# Patient Record
Sex: Male | Born: 1955
Health system: Southern US, Community
[De-identification: ages and names within clinical notes are randomized; demographics above are authoritative.]

## PROBLEM LIST (undated history)

## (undated) DIAGNOSIS — G20 Parkinson's disease: Secondary | ICD-10-CM

## (undated) DIAGNOSIS — R079 Chest pain, unspecified: Secondary | ICD-10-CM

## (undated) DIAGNOSIS — I4892 Unspecified atrial flutter: Secondary | ICD-10-CM

---

## 2017-01-10 DIAGNOSIS — E860 Dehydration: Secondary | ICD-10-CM | POA: Diagnosis not present

## 2017-01-10 DIAGNOSIS — R112 Nausea with vomiting, unspecified: Secondary | ICD-10-CM | POA: Diagnosis not present

## 2017-01-10 DIAGNOSIS — Z720 Tobacco use: Secondary | ICD-10-CM | POA: Diagnosis not present

## 2017-01-10 DIAGNOSIS — R34 Anuria and oliguria: Secondary | ICD-10-CM | POA: Diagnosis not present

## 2017-01-10 DIAGNOSIS — N179 Acute kidney failure, unspecified: Secondary | ICD-10-CM | POA: Diagnosis not present

## 2017-01-10 DIAGNOSIS — R7989 Other specified abnormal findings of blood chemistry: Secondary | ICD-10-CM | POA: Diagnosis not present

## 2017-01-10 DIAGNOSIS — R1110 Vomiting, unspecified: Secondary | ICD-10-CM | POA: Diagnosis not present

## 2017-08-03 ENCOUNTER — Other Ambulatory Visit: Payer: Self-pay

## 2017-08-03 ENCOUNTER — Emergency Department (HOSPITAL_COMMUNITY): Payer: Medicare Other

## 2017-08-03 ENCOUNTER — Encounter (HOSPITAL_COMMUNITY): Payer: Self-pay | Admitting: Emergency Medicine

## 2017-08-03 ENCOUNTER — Inpatient Hospital Stay (HOSPITAL_COMMUNITY)
Admission: EM | Admit: 2017-08-03 | Discharge: 2017-08-06 | DRG: 287 | Disposition: A | Discharge status: Home / Self Care | Payer: Medicare Other | Attending: Internal Medicine | Admitting: Internal Medicine

## 2017-08-03 DIAGNOSIS — G20 Parkinson's disease: Secondary | ICD-10-CM | POA: Diagnosis present

## 2017-08-03 DIAGNOSIS — I4892 Unspecified atrial flutter: Secondary | ICD-10-CM | POA: Diagnosis not present

## 2017-08-03 DIAGNOSIS — I10 Essential (primary) hypertension: Secondary | ICD-10-CM | POA: Diagnosis not present

## 2017-08-03 DIAGNOSIS — R778 Other specified abnormalities of plasma proteins: Secondary | ICD-10-CM

## 2017-08-03 DIAGNOSIS — I248 Other forms of acute ischemic heart disease: Secondary | ICD-10-CM | POA: Diagnosis not present

## 2017-08-03 DIAGNOSIS — R748 Abnormal levels of other serum enzymes: Secondary | ICD-10-CM | POA: Diagnosis not present

## 2017-08-03 DIAGNOSIS — K625 Hemorrhage of anus and rectum: Secondary | ICD-10-CM | POA: Diagnosis not present

## 2017-08-03 DIAGNOSIS — F17210 Nicotine dependence, cigarettes, uncomplicated: Secondary | ICD-10-CM | POA: Diagnosis present

## 2017-08-03 DIAGNOSIS — I420 Dilated cardiomyopathy: Secondary | ICD-10-CM

## 2017-08-03 DIAGNOSIS — I2510 Atherosclerotic heart disease of native coronary artery without angina pectoris: Secondary | ICD-10-CM | POA: Diagnosis present

## 2017-08-03 DIAGNOSIS — F1010 Alcohol abuse, uncomplicated: Secondary | ICD-10-CM | POA: Diagnosis not present

## 2017-08-03 DIAGNOSIS — K7689 Other specified diseases of liver: Secondary | ICD-10-CM | POA: Diagnosis not present

## 2017-08-03 DIAGNOSIS — R002 Palpitations: Secondary | ICD-10-CM | POA: Diagnosis not present

## 2017-08-03 DIAGNOSIS — R945 Abnormal results of liver function studies: Secondary | ICD-10-CM

## 2017-08-03 DIAGNOSIS — R079 Chest pain, unspecified: Secondary | ICD-10-CM | POA: Diagnosis not present

## 2017-08-03 DIAGNOSIS — R112 Nausea with vomiting, unspecified: Secondary | ICD-10-CM | POA: Diagnosis present

## 2017-08-03 DIAGNOSIS — R197 Diarrhea, unspecified: Secondary | ICD-10-CM | POA: Diagnosis present

## 2017-08-03 DIAGNOSIS — Z791 Long term (current) use of non-steroidal anti-inflammatories (NSAID): Secondary | ICD-10-CM | POA: Diagnosis not present

## 2017-08-03 DIAGNOSIS — I471 Supraventricular tachycardia, unspecified: Secondary | ICD-10-CM

## 2017-08-03 DIAGNOSIS — R7989 Other specified abnormal findings of blood chemistry: Secondary | ICD-10-CM

## 2017-08-03 DIAGNOSIS — R740 Nonspecific elevation of levels of transaminase and lactic acid dehydrogenase [LDH]: Secondary | ICD-10-CM | POA: Diagnosis present

## 2017-08-03 DIAGNOSIS — Z720 Tobacco use: Secondary | ICD-10-CM | POA: Diagnosis not present

## 2017-08-03 HISTORY — DX: Chest pain, unspecified: R07.9

## 2017-08-03 HISTORY — DX: Parkinson's disease: G20

## 2017-08-03 HISTORY — DX: Unspecified atrial flutter: I48.92

## 2017-08-03 LAB — COMPREHENSIVE METABOLIC PANEL
ALBUMIN: 3.8 g/dL (ref 3.5–5.0)
ALT: 93 U/L — ABNORMAL HIGH (ref 17–63)
AST: 79 U/L — AB (ref 15–41)
Alkaline Phosphatase: 51 U/L (ref 38–126)
Anion gap: 15 (ref 5–15)
BUN: 13 mg/dL (ref 6–20)
CHLORIDE: 109 mmol/L (ref 101–111)
CO2: 15 mmol/L — AB (ref 22–32)
Calcium: 8.7 mg/dL — ABNORMAL LOW (ref 8.9–10.3)
Creatinine, Ser: 0.95 mg/dL (ref 0.61–1.24)
GFR calc Af Amer: >60 mL/min (ref >60)
GFR calc non Af Amer: >60 mL/min (ref >60)
Glucose, Bld: 75 mg/dL (ref 65–99)
POTASSIUM: 3.6 mmol/L (ref 3.5–5.1)
SODIUM: 139 mmol/L (ref 135–145)
Total Bilirubin: 1 mg/dL (ref 0.3–1.2)
Total Protein: 7.1 g/dL (ref 6.5–8.1)

## 2017-08-03 LAB — I-STAT CHEM 8, ED
BUN: 17 mg/dL (ref 6–20)
CALCIUM ION: 0.99 mmol/L — AB (ref 1.15–1.40)
CREATININE: 1.1 mg/dL (ref 0.61–1.24)
Chloride: 108 mmol/L (ref 101–111)
GLUCOSE: 98 mg/dL (ref 65–99)
HCT: 49 % (ref 39.0–52.0)
Hemoglobin: 16.7 g/dL (ref 13.0–17.0)
Potassium: 4 mmol/L (ref 3.5–5.1)
Sodium: 138 mmol/L (ref 135–145)
TCO2: 17 mmol/L — AB (ref 22–32)

## 2017-08-03 LAB — CBC WITH DIFFERENTIAL/PLATELET
BASOS ABS: 0 10*3/uL (ref 0.0–0.1)
BASOS PCT: 0 %
EOS ABS: 0 10*3/uL (ref 0.0–0.7)
EOS PCT: 0 %
HCT: 47.5 % (ref 39.0–52.0)
Hemoglobin: 15.7 g/dL (ref 13.0–17.0)
Lymphocytes Relative: 19 %
Lymphs Abs: 0.9 10*3/uL (ref 0.7–4.0)
MCH: 31.9 pg (ref 26.0–34.0)
MCHC: 33.1 g/dL (ref 30.0–36.0)
MCV: 96.5 fL (ref 78.0–100.0)
MONO ABS: 0.5 10*3/uL (ref 0.1–1.0)
Monocytes Relative: 12 %
Neutro Abs: 3.2 10*3/uL (ref 1.7–7.7)
Neutrophils Relative %: 69 %
PLATELETS: 145 10*3/uL — AB (ref 150–400)
RBC: 4.92 MIL/uL (ref 4.22–5.81)
RDW: 13.5 % (ref 11.5–15.5)
WBC: 4.7 10*3/uL (ref 4.0–10.5)

## 2017-08-03 LAB — I-STAT TROPONIN, ED: TROPONIN I, POC: 0.05 ng/mL (ref 0.00–0.08)

## 2017-08-03 LAB — TSH: TSH: 0.406 u[IU]/mL (ref 0.350–4.500)

## 2017-08-03 LAB — TROPONIN I: Troponin I: 2.08 ng/mL (ref <0.03)

## 2017-08-03 LAB — T4, FREE: Free T4: 0.87 ng/dL (ref 0.61–1.12)

## 2017-08-03 MED ORDER — METOPROLOL TARTRATE 25 MG PO TABS
25.0000 mg | ORAL_TABLET | Freq: Two times a day (BID) | ORAL | Status: DC
  Administered 2017-08-03 – 2017-08-06 (×6): 25 mg via ORAL
  Filled 2017-08-03 (×6): qty 1

## 2017-08-03 MED ORDER — HEPARIN (PORCINE) IN NACL 100-0.45 UNIT/ML-% IJ SOLN
1250.0000 [IU]/h | INTRAMUSCULAR | Status: DC
  Administered 2017-08-03: 950 [IU]/h via INTRAVENOUS
  Administered 2017-08-04: 1200 [IU]/h via INTRAVENOUS
  Administered 2017-08-05: 1350 [IU]/h via INTRAVENOUS
  Administered 2017-08-06: 1250 [IU]/h via INTRAVENOUS
  Filled 2017-08-03 (×4): qty 250

## 2017-08-03 MED ORDER — NITROGLYCERIN 2 % TD OINT
0.5000 [in_us] | TOPICAL_OINTMENT | Freq: Three times a day (TID) | TRANSDERMAL | Status: DC
Start: 2017-08-03 — End: 2017-08-06

## 2017-08-03 MED ORDER — HEPARIN BOLUS VIA INFUSION
3500.0000 [IU] | Freq: Once | INTRAVENOUS | Status: AC
  Administered 2017-08-03: 3500 [IU] via INTRAVENOUS
  Filled 2017-08-03: qty 3500

## 2017-08-03 MED ORDER — ASPIRIN EC 325 MG PO TBEC
325.0000 mg | DELAYED_RELEASE_TABLET | Freq: Every day | ORAL | Status: AC
  Administered 2017-08-03: 325 mg via ORAL
  Filled 2017-08-03: qty 1

## 2017-08-03 NOTE — H&P (Addendum)
TRH H&P   Patient Demographics:    Brian George, is a 62 y.o. male  MRN: [id number]   DOB - Aug 31, 1955  Admit Date - 08/03/2017  Outpatient Primary MD for the patient is Patient, No Pcp Per None  Referring MD/NP/PA: Virgel Manifold  Outpatient Specialists:   No cardiology  Patient coming from: home  Chief Complaint  Patient presents with  . Chest Pain      HPI:    Brian George  is a 62 y.o. male,  w c/o dull throbbing in the chest (mid) started about 11am. After finishing drinking a beer.   Pt denies fever, chills, cough, sob, heartburn, abd pain.  While he was in the EMS truck , given a couple of injections, and during the ride had slight n/v, (no blood),  diarrhea (loose stool) with some blood in there.   In ED,   CXR IMPRESSION: 1. 1 cm nodular opacity overlying the mid left lung which may represent nipple. Recommend repeat chest radiograph with nipple markers when able. 2. Cardiomegaly without evidence of acute cardiopulmonary disease.   Wbc 4.7, Hgb 15.7, Plt 145 Na 139, K 3.6, Bun 13, Creatinine 0.95 Ast 79, Alt 93, Alk phos 51, T. Bili 1.0  Hco3 15  EKG atrial flutter 2:1  Pt will be admitted for Atrial flutter with RVR,n/v, diarrhea, 1cm lung nodule, abnormal liver function.      Review of systems:    In addition to the HPI above,  No Fever-chills, No Headache, No changes with Vision or hearing, No problems swallowing food or Liquids, No Cough or Shortness of Breath,  No Blood in  Urine, No dysuria, No new skin rashes or bruises, No new joints pains-aches,  No new weakness, tingling, numbness in any extremity, No recent weight gain or loss, No polyuria, polydypsia or polyphagia, No significant Mental Stressors.  A full 10 point Review of Systems was done, except as stated above, all other Review of Systems were negative.   With Past  History of the following :    Past Medical History:  Diagnosis Date  . Atrial flutter with rapid ventricular response ([place]) 08/03/2017  . Chest pain with moderate risk for cardiac etiology 08/03/2017  . Parkinson's disease ([place])       History reviewed. No pertinent surgical history.    Social History:     Social History   Tobacco Use  . Smoking status: Current Every Day Smoker    Packs/day: 0.50    Types: Cigarettes  . Smokeless tobacco: Never Used  Substance Use Topics  . Alcohol use: Yes    Alcohol/week: 25.2 oz    Types: 42 Cans of beer per week    Comment: six-pack per day     Lives - at home  Mobility - walks by self    Family History :     Family History  Problem Relation Age of Onset  . Heart defect Father 33       Congenital heart disease  . Heart defect Brother 38       Congenital heart disease     Home Medications:   Prior to Admission medications   Not on File     Allergies:    Not on File   Physical Exam:   Vitals  Blood pressure (!) 151/113, pulse 79, resp. rate (!) 22, height '5\' 7"'$  (1.702 m), weight 64 kg (141 lb), SpO2 100 %.   1. General lying in bed in NAD,   2. Normal affect and insight, Not Suicidal or Homicidal, Awake Alert, Oriented X 3.  3. No F.N deficits, ALL C.Nerves Intact, Strength 5/5 all 4 extremities, Sensation intact all 4 extremities, Plantars down going.  4. Ears and Eyes appear Normal, Conjunctivae clear, PERRLA. Moist Oral Mucosa.  5. Supple Neck, No JVD, No cervical lymphadenopathy appriciated, No Carotid Bruits.  6. Symmetrical Chest wall movement, Good air movement bilaterally, CTAB.  7. RRR, No Gallops, Rubs or Murmurs, No Parasternal Heave.  8. Positive Bowel Sounds, Abdomen Soft, No tenderness, No organomegaly appriciated,No rebound -guarding or rigidity.  9.  No Cyanosis, Normal Skin Turgor, No Skin Rash or Bruise.  10. Good muscle tone,  joints appear normal , no effusions, Normal ROM.  11. No  Palpable Lymph Nodes in Neck or Axillae     Data Review:    CBC Recent Labs  Lab 08/03/17 1404 08/03/17 1440  WBC  --  4.7  HGB 16.7 15.7  HCT 49.0 47.5  PLT  --  145*  MCV  --  96.5  MCH  --  31.9  MCHC  --  33.1  RDW  --  13.5  LYMPHSABS  --  0.9  MONOABS  --  0.5  EOSABS  --  0.0  BASOSABS  --  0.0   ------------------------------------------------------------------------------------------------------------------  Chemistries  Recent Labs  Lab 08/03/17 1404 08/03/17 1440  NA 138 139  K 4.0 3.6  CL 108 109  CO2  --  15*  GLUCOSE 98 75  BUN 17 13  CREATININE 1.10 0.95  CALCIUM  --  8.7*  AST  --  79*  ALT  --  93*  ALKPHOS  --  51  BILITOT  --  1.0   ------------------------------------------------------------------------------------------------------------------ estimated creatinine clearance is 75.8 mL/min (by C-G formula based on SCr of 0.95 mg/dL). ------------------------------------------------------------------------------------------------------------------ No results for input(s): TSH, T4TOTAL, T3FREE, THYROIDAB in the last 72 hours.  Invalid input(s): FREET3  Coagulation profile No results for input(s): INR, PROTIME in the last 168 hours. ------------------------------------------------------------------------------------------------------------------- No results for input(s): DDIMER in the last 72 hours. -------------------------------------------------------------------------------------------------------------------  Cardiac Enzymes No results for input(s): CKMB, TROPONINI, MYOGLOBIN in the last 168 hours.  Invalid input(s): CK ------------------------------------------------------------------------------------------------------------------ No results found for: BNP   ---------------------------------------------------------------------------------------------------------------  Urinalysis No results found for: COLORURINE,  APPEARANCEUR, LABSPEC, PHURINE, GLUCOSEU, HGBUR, BILIRUBINUR, KETONESUR, PROTEINUR, UROBILINOGEN, NITRITE, LEUKOCYTESUR  ----------------------------------------------------------------------------------------------------------------   Imaging Results:    Dg Chest Port 1 View  Result Date: 08/03/2017 CLINICAL DATA:  Weakness. EXAM: PORTABLE CHEST 1 VIEW COMPARISON:  None. FINDINGS: Cardiomegaly identified. A 1 cm nodular opacity overlying the mid left lung is noted. There is no evidence of focal airspace disease, pulmonary edema, pleural effusion, or pneumothorax. No acute bony abnormalities are identified. IMPRESSION: 1. 1 cm nodular opacity overlying the mid left lung which may represent nipple. Recommend repeat chest radiograph with nipple markers when able.  2. Cardiomegaly without evidence of acute cardiopulmonary disease. Electronically Signed   By: Margarette Canada M.D.   On: 08/03/2017 14:04       Assessment & Plan:    Principal Problem:   Atrial flutter with rapid ventricular response (HCC) Active Problems:   Chest pain with moderate risk for cardiac etiology   Atrial flutter (HCC)   Rectal bleeding    Aflutter with RVR Resolved w lopressor 55m iv x1 Check tsh Aspirin 3226mpo qday Start Metoprolol 2580mo bid Cardiology consult, input appreciated  Chest discomfort Tele Trop I q6h x3 Check Cardiac echo Start Aspirin Start metoprolol Start nitro paste 2% topically q8h Check Lipid No Statin due to abnormal liver function  Troponin elevation Cont cycle cardiac marker Heparin iv pharmacy to dose Cardiology notified of trop elevation I will repeat EKG   Abnormal lft Check acute hepatitis panel Check RUQ ultrasound  N/v zofran 4mg45m q6h prn  protonix 40mg66mbid  Diarrhea Check GI pathogen panel Check C. Diff    Rectal bleeding NPO  Hydrate with ns iv Please contact GI in am regarding need for screening colonoscopy   DVT Prophylaxis    SCDs  AM Labs  Ordered, also please review Full Orders  Family Communication: Admission, patients condition and plan of care including tests being ordered have been discussed with the patient  who indicate understanding and agree with the plan and Code Status.  Code Status FULL CODE  Likely DC to  home  Condition GUARDED   Consults called: cardiology by ED  Admission status: inpatient  Time spent in minutes : 45   JamesJani Gravelon 08/03/2017 at 6:42 PM  Between 7am to 7pm - Pager - [phone] 7pm go to [email or web address] - password [email or web address] - Office  [phone]

## 2017-08-03 NOTE — ED Triage Notes (Addendum)
Spontaneous onset rapid heart rate of 230--- to ED via [facility] , pt c/o chest pain initially -- on arrival to ED pt is gray, diaphoretic-- semi responsive, but will answer questions, -- pt vomited enroute,  Incontinent of stool. Pt denies chest pain-- Cap refill is > 5 sec

## 2017-08-03 NOTE — ED Provider Notes (Signed)
[facility] Provider Note   CSN: [id number] Arrival date & time: 08/03/17  1344     History   Chief Complaint Chief Complaint  Patient presents with  . Chest Pain    HPI Brian George is a 62 y.o. male.  Patient states that he started feeling weak and dizzy and having palpitations.  He has a history of rapid atrial flutter 1 time 2 years ago.  He is not followed up with anyone after that episode.  Patient called the paramedics and when they arrived the patient's heart rate was at 220.  The patient was given adenosine twice without results.  Then he was cardioverted once without results.  Then the patient was given Lopressor 5 mg twice and when he arrived at the ER his heart rate was 75 but he was still in a flutter.   The history is provided by the patient and a caregiver. No language interpreter was used.  Chest Pain   This is a new problem. The current episode started 1 to 2 hours ago. The problem occurs constantly. The problem has been resolved. The pain is associated with exertion. The pain is present in the substernal region. The pain is at a severity of 4/10. The pain is moderate. The quality of the pain is described as heavy. Associated symptoms include palpitations. Pertinent negatives include no abdominal pain, no back pain, no cough and no headaches.  Pertinent negatives for past medical history include no seizures.    Past Medical History:  Diagnosis Date  . Parkinson's disease (HCC)     There are no active problems to display for this patient.   History reviewed. No pertinent surgical history.     Home Medications    Prior to Admission medications   Not on File    Family History No family history on file.  Social History Social History   Tobacco Use  . Smoking status: Current Every Day Smoker    Packs/day: 0.50    Types: Cigarettes  . Smokeless tobacco: Never Used  Substance Use Topics  . Alcohol use: Yes      Alcohol/week: 25.2 oz    Types: 42 Cans of beer per week    Comment: six-pack per day  . Drug use: No     Allergies   Patient has no allergy information on record.   Review of Systems Review of Systems  Constitutional: Negative for appetite change and fatigue.  HENT: Negative for congestion, ear discharge and sinus pressure.   Eyes: Negative for discharge.  Respiratory: Negative for cough.   Cardiovascular: Positive for chest pain and palpitations.  Gastrointestinal: Negative for abdominal pain and diarrhea.  Genitourinary: Negative for frequency and hematuria.  Musculoskeletal: Negative for back pain.  Skin: Negative for rash.  Neurological: Negative for seizures and headaches.  Psychiatric/Behavioral: Negative for hallucinations.     Physical Exam Updated Vital Signs BP (!) 143/112   Pulse 82   Resp (!) 21   Ht 5\' 7"  (1.702 m)   Wt 64 kg (141 lb)   SpO2 100%   BMI 22.08 kg/m   Physical Exam  Constitutional: He is oriented to person, place, and time. He appears well-developed.  HENT:  Head: Normocephalic.  Eyes: Conjunctivae and EOM are normal. No scleral icterus.  Neck: Neck supple. No thyromegaly present.  Cardiovascular: Normal rate and regular rhythm. Exam reveals no gallop and no friction rub.  No murmur heard. Pulmonary/Chest: No stridor. He has no wheezes.  He has no rales. He exhibits no tenderness.  Abdominal: He exhibits no distension. There is no tenderness. There is no rebound.  Musculoskeletal: Normal range of motion. He exhibits no edema.  Lymphadenopathy:    He has no cervical adenopathy.  Neurological: He is oriented to person, place, and time. He exhibits normal muscle tone. Coordination normal.  Skin: No rash noted. No erythema.  Psychiatric: He has a normal mood and affect. His behavior is normal.     ED Treatments / Results  Labs (all labs ordered are listed, but only abnormal results are displayed) Labs Reviewed  CBC WITH  DIFFERENTIAL/PLATELET - Abnormal; Notable for the following components:      Result Value   Platelets 145 (*)    All other components within normal limits  COMPREHENSIVE METABOLIC PANEL - Abnormal; Notable for the following components:   CO2 15 (*)    Calcium 8.7 (*)    AST 79 (*)    ALT 93 (*)    All other components within normal limits  I-STAT CHEM 8, ED - Abnormal; Notable for the following components:   Calcium, Ion 0.99 (*)    TCO2 17 (*)    All other components within normal limits  I-STAT TROPONIN, ED    EKG  EKG Interpretation  Date/Time:  Friday August 03 2017 13:44:27 EST Ventricular Rate:  76 PR Interval:    QRS Duration: 101 QT Interval:  407 QTC Calculation: 458 R Axis:   -63 Text Interpretation:  Atrial fibrillation Left anterior fascicular block LVH with secondary repolarization abnormality Repol abnrm, severe global ischemia (LM/MVD) Confirmed by Bethann Berkshire [phone]) on 08/03/2017 1:48:37 PM       Radiology Dg Chest Port 1 View  Result Date: 08/03/2017 CLINICAL DATA:  Weakness. EXAM: PORTABLE CHEST 1 VIEW COMPARISON:  None. FINDINGS: Cardiomegaly identified. A 1 cm nodular opacity overlying the mid left lung is noted. There is no evidence of focal airspace disease, pulmonary edema, pleural effusion, or pneumothorax. No acute bony abnormalities are identified. IMPRESSION: 1. 1 cm nodular opacity overlying the mid left lung which may represent nipple. Recommend repeat chest radiograph with nipple markers when able. 2. Cardiomegaly without evidence of acute cardiopulmonary disease. Electronically Signed   By: Harmon Pier M.D.   On: 08/03/2017 14:04    Procedures Procedures (including critical care time)  Medications Ordered in ED Medications - No data to display   Initial Impression / Assessment and Plan / ED Course  I have reviewed the triage vital signs and the nursing notes.  Pertinent labs & imaging results that were available during my care of the  patient were reviewed by me and considered in my medical decision making (see chart for details). Patient with rapid atrial flutter.  He will be admitted to cardiology.        Final Clinical Impressions(s) / ED Diagnoses   Final diagnoses:  Atrial flutter, unspecified type [facility])    ED Discharge Orders    None       Bethann Berkshire, MD 08/03/17 1547

## 2017-08-03 NOTE — Consult Note (Signed)
Cardiology Consultation:   Patient ID: Brian George; [id number]; 02/06/1956   Admit date: 08/03/2017 Date of Consult: 08/03/2017  Primary Care Provider: Patient, No Pcp Per Primary Cardiologist: New, Dr Duke Salviaandolph Primary Electrophysiologist:  n/a   Patient Profile:   Brian George is a 62 y.o. male with a hx of arrhythmia, Parkinson's dz (early stages) who is being seen today for the evaluation of atrial flutter at the request of Dr Estell HarpinZammit.  History of Present Illness:   Brian George had tachycardia 2 years ago, and was seen at [facility] in [place], [place]. He describes an echo, MRI and cardiac cath. He was told no CAD, no congenital heart problems. He was put on some medication, but did not follow up. No hx DM, HTN, HLD, +tob, mod-high ETOH  No problems since then, till today.   Daily, he walks around the neighborhood, comes home and has some beer.   Today, he had sudden onset of a dull throb in his abdomen while watching TV. He got dressed and told his sister to call EMS. As he was walking, the pain started shooting into his chest. He could barely open his eyes. He sat down. He knew his LOC was decreasing. He remembers being given IV meds that did not help by EMS and then got shocked, does NOT want that to happen again.   The pain and throbbing continued. He was brought to the ER and spontaneously converted in the hallway. He has been in SR since then. Chest pain has resolved.   He has not had chest pain in the 2 years since the tachycardia.    Past Medical History:  Diagnosis Date  . Atrial flutter with rapid ventricular response (HCC) 08/03/2017  . Chest pain with moderate risk for cardiac etiology 08/03/2017  . Parkinson's disease (HCC)     History reviewed. No pertinent surgical history.   Prior to Admission medications   Not on File    Inpatient Medications: Scheduled Meds: Continuous Infusions: PRN Meds:  Allergies:   Not on File  Social History:   Social  History   Socioeconomic History  . Marital status: Single    Spouse name: Not on file  . Number of children: Not on file  . Years of education: Not on file  . Highest education level: Not on file  Social Needs  . Financial resource strain: Not on file  . Food insecurity - worry: Not on file  . Food insecurity - inability: Not on file  . Transportation needs - medical: Not on file  . Transportation needs - non-medical: Not on file  Occupational History  . 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 Smoking status: Current Every Day Smoker    Packs/day: 0.50    Types: Cigarettes  . Smokeless tobacco: Never Used  Substance and Sexual Activity  . Alcohol use: Yes    Alcohol/week: 25.2 oz    Types: 42 Cans of beer per week    Comment: six-pack per day  . Drug use: No  . Sexual activity: Not on file  Other Topics Concern  . Not on file  Social History Narrative   Pt moved to [place] 03/2017 and lives with his sisters.     Family History:   Family History  Problem Relation Age of Onset  . Heart defect Father 3652       Congenital heart disease  . Heart defect Brother 38       Congenital  heart disease   Family Status:  Family Status  Relation Name Status  . Mother  Deceased  . Father  Deceased  . Sister  Alive  . Brother  Deceased  . Sister  Alive    ROS:  Please see the history of present illness.  All other ROS reviewed and negative.     Physical Exam/Data:   Vitals:   08/03/17 1359 08/03/17 1415 08/03/17 1430 08/03/17 1630  BP:  (!) 121/93 (!) 143/112 (!) 147/112  Pulse:  82 82 81  Resp:  (!) 21 (!) 21   SpO2:  100% 100% 100%  Weight: 141 lb (64 kg)     Height: 5\' 7"  (1.702 m)       Intake/Output Summary (Last 24 hours) at 08/03/2017 1655 Last data filed at 08/03/2017 1652 Gross per 24 hour  Intake -  Output 1250 ml  Net -1250 ml   Filed Weights   08/03/17 1359  Weight: 141 lb (64 kg)   Body mass index is 22.08 kg/m.    General:  Well nourished, well developed, in no acute distress HEENT: normal Lymph: no adenopathy Neck: no JVD Endocrine:  No thryomegaly Vascular: No carotid bruits; 4/4 extremity pulses 2+, without bruits  Cardiac:  normal S1, S2; RRR; no murmur  Lungs:  clear to auscultation bilaterally, no wheezing, rhonchi or rales  Abd: soft, nontender, no hepatomegaly  Ext: no edema Musculoskeletal:  No deformities, BUE and BLE strength normal and equal Skin: warm and dry  Neuro:  CNs 2-12 intact, no focal abnormalities noted Psych:  Normal affect   EKG:  The EKG was personally reviewed and demonstrates:  EMS ECG is atrial flutter w/ HR 232, ?1:1 conduction.  ER ECG is atrial flutter, HR 76  Telemetry:  Telemetry was personally reviewed and demonstrates:  SR  Relevant CV Studies:  ECHO:  CATH:   Laboratory Data:  Chemistry Recent Labs  Lab 08/03/17 1404 08/03/17 1440  NA 138 139  K 4.0 3.6  CL 108 109  CO2  --  15*  GLUCOSE 98 75  BUN 17 13  CREATININE 1.10 0.95  CALCIUM  --  8.7*  GFRNONAA  --  >60  GFRAA  --  >60  ANIONGAP  --  15    Lab Results  Component Value Date   ALT 93 (H) 08/03/2017   AST 79 (H) 08/03/2017   ALKPHOS 51 08/03/2017   BILITOT 1.0 08/03/2017   Hematology Recent Labs  Lab 08/03/17 1404 08/03/17 1440  WBC  --  4.7  RBC  --  4.92  HGB 16.7 15.7  HCT 49.0 47.5  MCV  --  96.5  MCH  --  31.9  MCHC  --  33.1  RDW  --  13.5  PLT  --  145*   Cardiac EnzymesNo results for input(s): TROPONINI in the last 168 hours.  Recent Labs  Lab 08/03/17 1403  TROPIPOC 0.05     Radiology/Studies:  Dg Chest Port 1 View  Result Date: 08/03/2017 CLINICAL DATA:  Weakness. EXAM: PORTABLE CHEST 1 VIEW COMPARISON:  None. FINDINGS: Cardiomegaly identified. A 1 cm nodular opacity overlying the mid left lung is noted. There is no evidence of focal airspace disease, pulmonary edema, pleural effusion, or pneumothorax. No acute bony abnormalities are identified.  IMPRESSION: 1. 1 cm nodular opacity overlying the mid left lung which may represent nipple. Recommend repeat chest radiograph with nipple markers when able. 2. Cardiomegaly without evidence of acute cardiopulmonary disease. Electronically  Signed   By: Harmon Pier M.D.   On: 08/03/2017 14:04    Assessment and Plan:   Principal Problem:   Atrial flutter with rapid ventricular response (HCC)  - HR spont slowed and then converted to SR - HR controlled for now, add BB  - heparin for now, add Eliquis if H&H stable overnight - but need to make sure he will be able to afford it.  Active Problems:   Chest pain with moderate risk for cardiac etiology - pt had negative workup 2 yr ago in [place] - cycle ez, ck echo - decide in am if further eval needed, if ez are negative, may not  3. HTN - Per pt, no history, cards will be adding BB - otherwise, per IM  4. Bloody stools - pt w/ GI upset, high daily ETOH use, blood seen in bedpan after BM - per IM    For questions or updates, please contact [facility] Please consult [email or web address] for contact info under Cardiology/STEMI.   SignedTheodore Demark, PA-C  08/03/2017 4:55 PM

## 2017-08-03 NOTE — Progress Notes (Signed)
ANTICOAGULATION CONSULT NOTE - Initial Consult  Pharmacy Consult for Heparin Indication: atrial fibrillation  Not on File  Patient Measurements: Height: 5\' 7"  (170.2 cm) Weight: 141 lb (64 kg) IBW/kg (Calculated) : 66.1 Heparin Dosing Weight: 64 kg   Vital Signs: BP: 151/113 (01/25 1715) Pulse Rate: 79 (01/25 1715)  Labs: Recent Labs    08/03/17 1404 08/03/17 1440  HGB 16.7 15.7  HCT 49.0 47.5  PLT  --  145*  CREATININE 1.10 0.95    Estimated Creatinine Clearance: 75.8 mL/min (by C-G formula based on SCr of 0.95 mg/dL).   Medical History: Past Medical History:  Diagnosis Date  . Atrial flutter with rapid ventricular response (HCC) 08/03/2017  . Chest pain with moderate risk for cardiac etiology 08/03/2017  . Parkinson's disease (HCC)     Medications:   (Not in a hospital admission)  Assessment: [age] YOM here with Afib w/ RVR who spontaneously converted to SR. Pharmacy consulted to start IV heparin with plans to switch to Eliquis tomorrow if H&H stable overnight.   Goal of Therapy:  Heparin level 0.3-0.7 units/ml Monitor platelets by anticoagulation protocol: Yes   Plan:  -Heparin 3500 units IV once, then start heparin drip at 950 units/hr -F/u 6 hr HL -Monitor daily HL, CBC and s/s of bleeding  Vinnie LevelBenjamin Calyx Hawker, PharmD., BCPS Clinical Pharmacist Pager [phone]

## 2017-08-04 ENCOUNTER — Inpatient Hospital Stay (HOSPITAL_COMMUNITY): Payer: Medicare Other

## 2017-08-04 ENCOUNTER — Other Ambulatory Visit: Payer: Self-pay

## 2017-08-04 DIAGNOSIS — K7689 Other specified diseases of liver: Secondary | ICD-10-CM

## 2017-08-04 DIAGNOSIS — Z720 Tobacco use: Secondary | ICD-10-CM

## 2017-08-04 DIAGNOSIS — I10 Essential (primary) hypertension: Secondary | ICD-10-CM

## 2017-08-04 DIAGNOSIS — R079 Chest pain, unspecified: Secondary | ICD-10-CM

## 2017-08-04 DIAGNOSIS — F1010 Alcohol abuse, uncomplicated: Secondary | ICD-10-CM

## 2017-08-04 LAB — CBC
HEMATOCRIT: 45.3 % (ref 39.0–52.0)
HEMOGLOBIN: 15.2 g/dL (ref 13.0–17.0)
MCH: 31.7 pg (ref 26.0–34.0)
MCHC: 33.6 g/dL (ref 30.0–36.0)
MCV: 94.4 fL (ref 78.0–100.0)
Platelets: 241 10*3/uL (ref 150–400)
RBC: 4.8 MIL/uL (ref 4.22–5.81)
RDW: 13.6 % (ref 11.5–15.5)
WBC: 7 10*3/uL (ref 4.0–10.5)

## 2017-08-04 LAB — HEPARIN LEVEL (UNFRACTIONATED)
HEPARIN UNFRACTIONATED: 0.22 [IU]/mL — AB (ref 0.30–0.70)
HEPARIN UNFRACTIONATED: 0.36 [IU]/mL (ref 0.30–0.70)
Heparin Unfractionated: 0.1 IU/mL — ABNORMAL LOW (ref 0.30–0.70)
Heparin Unfractionated: 0.36 IU/mL (ref 0.30–0.70)

## 2017-08-04 LAB — CK TOTAL AND CKMB (NOT AT ARMC)
CK TOTAL: 229 U/L (ref 49–397)
CK, MB: 12.3 ng/mL — ABNORMAL HIGH (ref 0.5–5.0)
Relative Index: 5.4 — ABNORMAL HIGH (ref 0.0–2.5)

## 2017-08-04 LAB — LIPID PANEL
CHOL/HDL RATIO: 2.8 ratio
CHOLESTEROL: 167 mg/dL (ref 0–200)
HDL: 59 mg/dL (ref >40)
LDL Cholesterol: 99 mg/dL (ref 0–99)
Triglycerides: 46 mg/dL (ref <150)
VLDL: 9 mg/dL (ref 0–40)

## 2017-08-04 LAB — ECHOCARDIOGRAM COMPLETE
AOASC: 38 cm
Area-P 1/2: 4.07 cm2
E decel time: 183 msec
EERAT: 9.15
FS: 17 % — AB (ref 28–44)
Height: 67 in
IVS/LV PW RATIO, ED: 0.86
LADIAMINDEX: 1.09 cm/m2
LASIZE: 20 mm
LAVOLA4C: 19.1 mL
LDCA: 3.8 cm2
LEFT ATRIUM END SYS DIAM: 20 mm
LV PW d: 14.5 mm — AB (ref 0.6–1.1)
LV SIMPSON'S DISK: 44
LV sys vol: 59 mL (ref 21–61)
LVDIAVOL: 106 mL (ref 62–150)
LVDIAVOLIN: 58 mL/m2
LVEEAVG: 9.15
LVEEMED: 9.15
LVELAT: 8.16 cm/s
LVOT VTI: 17.8 cm
LVOT diameter: 22 mm
LVOT peak grad rest: 3 mmHg
LVOTPV: 90.2 cm/s
LVOTSV: 68 mL
LVSYSVOLIN: 32 mL/m2
MV Dec: 183
MV Peak grad: 2 mmHg
MVPKAVEL: 68.4 m/s
MVPKEVEL: 74.7 m/s
P 1/2 time: 54 ms
RV LATERAL S' VELOCITY: 13.2 cm/s
RV sys press: 19 mmHg
Reg peak vel: 197 cm/s
Stroke v: 47 ml
TAPSE: 17.5 mm
TDI e' lateral: 8.16
TDI e' medial: 5.87
TR max vel: 197 cm/s
Weight: 2552 oz

## 2017-08-04 LAB — COMPREHENSIVE METABOLIC PANEL
ALT: 77 U/L — AB (ref 17–63)
AST: 48 U/L — AB (ref 15–41)
Albumin: 3.6 g/dL (ref 3.5–5.0)
Alkaline Phosphatase: 47 U/L (ref 38–126)
Anion gap: 8 (ref 5–15)
BILIRUBIN TOTAL: 1.7 mg/dL — AB (ref 0.3–1.2)
BUN: 11 mg/dL (ref 6–20)
CALCIUM: 8.7 mg/dL — AB (ref 8.9–10.3)
CHLORIDE: 107 mmol/L (ref 101–111)
CO2: 21 mmol/L — ABNORMAL LOW (ref 22–32)
CREATININE: 0.92 mg/dL (ref 0.61–1.24)
GFR calc non Af Amer: >60 mL/min (ref >60)
Glucose, Bld: 75 mg/dL (ref 65–99)
Potassium: 3.6 mmol/L (ref 3.5–5.1)
Sodium: 136 mmol/L (ref 135–145)
Total Protein: 7 g/dL (ref 6.5–8.1)

## 2017-08-04 LAB — TROPONIN I
TROPONIN I: 1.96 ng/mL — AB (ref <0.03)
TROPONIN I: 1.04 ng/mL — AB (ref <0.03)

## 2017-08-04 LAB — TSH: TSH: 0.367 u[IU]/mL (ref 0.350–4.500)

## 2017-08-04 LAB — HEMOGLOBIN A1C
Hgb A1c MFr Bld: 5.2 % (ref 4.8–5.6)
MEAN PLASMA GLUCOSE: 102.54 mg/dL

## 2017-08-04 MED ORDER — SODIUM CHLORIDE 0.9 % IV SOLN
INTRAVENOUS | Status: AC
  Administered 2017-08-04 (×2): via INTRAVENOUS

## 2017-08-04 MED ORDER — PANTOPRAZOLE SODIUM 40 MG IV SOLR
40.0000 mg | Freq: Two times a day (BID) | INTRAVENOUS | Status: DC
  Administered 2017-08-04 – 2017-08-06 (×5): 40 mg via INTRAVENOUS
  Filled 2017-08-04 (×6): qty 40

## 2017-08-04 MED ORDER — HEPARIN BOLUS VIA INFUSION
2000.0000 [IU] | Freq: Once | INTRAVENOUS | Status: AC
  Administered 2017-08-04: 2000 [IU] via INTRAVENOUS
  Filled 2017-08-04: qty 2000

## 2017-08-04 MED ORDER — ONDANSETRON HCL 4 MG/2ML IJ SOLN: 4.0000 mg | Freq: Four times a day (QID) | INTRAMUSCULAR | Status: DC | PRN

## 2017-08-04 MED ORDER — HEPARIN BOLUS VIA INFUSION
1100.0000 [IU] | Freq: Once | INTRAVENOUS | Status: AC
  Administered 2017-08-04: 1100 [IU] via INTRAVENOUS
  Filled 2017-08-04: qty 1100

## 2017-08-04 NOTE — Progress Notes (Signed)
ANTICOAGULATION CONSULT NOTE  Pharmacy Consult for Heparin Indication: atrial fibrillation  Not on File  Patient Measurements: Height: 5\' 7"  (170.2 cm) Weight: 159 lb 8 oz (72.3 kg) IBW/kg (Calculated) : 66.1 Heparin Dosing Weight: 72.3 kg   Vital Signs: Temp: 97.7 F (36.5 C) (01/26 1500) Temp Source: Oral (01/26 1500) BP: 153/116 (01/26 1500) Pulse Rate: 58 (01/26 1500)  Labs: Recent Labs    08/03/17 1404 08/03/17 1440 08/03/17 1733 08/04/17 0311 08/04/17 0919 08/04/17 1442  HGB 16.7 15.7  --  15.2  --   --   HCT 49.0 47.5  --  45.3  --   --   PLT  --  145*  --  241  --   --   HEPARINUNFRC  --   --   --  0.10* 0.36 0.22*  CREATININE 1.10 0.95  --  0.92  --   --   CKTOTAL  --   --   --  229  --   --   CKMB  --   --   --  12.3*  --   --   TROPONINI  --   --  2.08* 1.96* 1.04*  --     Estimated Creatinine Clearance: 80.8 mL/min (by C-G formula based on SCr of 0.92 mg/dL).   Medical History: Past Medical History:  Diagnosis Date  . Atrial flutter with rapid ventricular response (HCC) 08/03/2017  . Chest pain with moderate risk for cardiac etiology 08/03/2017  . Parkinson's disease (HCC)     Medications:  No medications prior to admission.    Assessment: 89 YOM here with Afib w/ RVR who spontaneously converted to SR. Pharmacy consulted for IV heparin with plans to switch to Eliquis later. He is also noted with elevated troponin and plans are for Echo. Rectal bleeding noted at admission but Hg is stable.   Heparin level subtherapeutic at 0.22. RN confirms no issues with IV lines and no interruptions to therapy.   Goal of Therapy:  Heparin level 0.3-0.7 units/ml Monitor platelets by anticoagulation protocol: Yes   Plan:  - Heparin 1100 unit IV bolus - Increase Heparin gtt to 1350 units hour - 6-hour heparin level - Daily heparin level and CBC  Adline PotterSabrina Levie Owensby, PharmD Pharmacy Resident Pager: [phone]

## 2017-08-04 NOTE — Progress Notes (Signed)
Pt arrived to 4E13 from ED. VSS, tele placed and verified x2. Currently NSR in 60s. Pt denies any SOB, chest pain, or abdominal pain. Oriented to room and call light. Updated on plan of care. Will continue to monitor.

## 2017-08-04 NOTE — Progress Notes (Addendum)
Progress Note  Patient Name: Brian George Date of Encounter: 08/04/2017  Primary Cardiologist: No primary care provider on file.   Subjective   Denies any chest pain or SOB.  Maintaining NSR  Inpatient Medications    Scheduled Meds: . metoprolol tartrate  25 mg Oral BID  . nitroGLYCERIN  0.5 inch Topical Q8H  . pantoprazole (PROTONIX) IV  40 mg Intravenous Q12H   Continuous Infusions: . sodium chloride 75 mL/hr at 08/04/17 1002  . heparin 1,200 Units/hr (08/04/17 0359)   PRN Meds: ondansetron (ZOFRAN) IV   Vital Signs    Vitals:   08/04/17 0000 08/04/17 0121 08/04/17 0518 08/04/17 0903  BP: (!) 143/106 (!) 169/121 (!) 148/110 (!) 154/110  Pulse: (!) 59 (!) 58 61 60  Resp: (!) 21 18 (!) 28   Temp:  99.5 F (37.5 C) 99.2 F (37.3 C) 99.1 F (37.3 C)  TempSrc:  Oral Oral Oral  SpO2: 100% 96% 96% 97%  Weight:  159 lb 8 oz (72.3 kg)    Height:  5\' 7"  (1.702 m)      Intake/Output Summary (Last 24 hours) at 08/04/2017 1015 Last data filed at 08/04/2017 0300 Gross per 24 hour  Intake 189.07 ml  Output 1350 ml  Net -1160.93 ml   Filed Weights   08/03/17 1359 08/04/17 0121  Weight: 141 lb (64 kg) 159 lb 8 oz (72.3 kg)    Telemetry    NSR - Personally Reviewed  ECG    NSR with T wave inversions inferiorly - Personally Reviewed  Physical Exam   GEN: No acute distress.   Neck: No JVD Cardiac: RRR, no murmurs, rubs, or gallops.  Respiratory: Clear to auscultation bilaterally. GI: Soft, nontender, non-distended  MS: No edema; No deformity. Neuro:  Nonfocal  Psych: Normal affect   Labs    Chemistry Recent Labs  Lab 08/03/17 1404 08/03/17 1440 08/04/17 0311  NA 138 139 136  K 4.0 3.6 3.6  CL 108 109 107  CO2  --  15* 21*  GLUCOSE 98 75 75  BUN 17 13 11   CREATININE 1.10 0.95 0.92  CALCIUM  --  8.7* 8.7*  PROT  --  7.1 7.0  ALBUMIN  --  3.8 3.6  AST  --  79* 48*  ALT  --  93* 77*  ALKPHOS  --  51 47  BILITOT  --  1.0 1.7*  GFRNONAA  --   >60 >60  GFRAA  --  >60 >60  ANIONGAP  --  15 8     Hematology Recent Labs  Lab 08/03/17 1404 08/03/17 1440 08/04/17 0311  WBC  --  4.7 7.0  RBC  --  4.92 4.80  HGB 16.7 15.7 15.2  HCT 49.0 47.5 45.3  MCV  --  96.5 94.4  MCH  --  31.9 31.7  MCHC  --  33.1 33.6  RDW  --  13.5 13.6  PLT  --  145* 241    Cardiac Enzymes Recent Labs  Lab 08/03/17 1733 08/04/17 0311  TROPONINI 2.08* 1.96*    Recent Labs  Lab 08/03/17 1403  TROPIPOC 0.05     BNPNo results for input(s): BNP, PROBNP in the last 168 hours.   DDimer No results for input(s): DDIMER in the last 168 hours.   Radiology    Dg Chest Port 1 View  Result Date: 08/03/2017 CLINICAL DATA:  Weakness. EXAM: PORTABLE CHEST 1 VIEW COMPARISON:  None. FINDINGS: Cardiomegaly identified. A 1 cm nodular  opacity overlying the mid left lung is noted. There is no evidence of focal airspace disease, pulmonary edema, pleural effusion, or pneumothorax. No acute bony abnormalities are identified. IMPRESSION: 1. 1 cm nodular opacity overlying the mid left lung which may represent nipple. Recommend repeat chest radiograph with nipple markers when able. 2. Cardiomegaly without evidence of acute cardiopulmonary disease. Electronically Signed   By: Harmon Pier M.D.   On: 08/03/2017 14:04   US Abdomen Limited Ruq  Result Date: 08/04/2017 CLINICAL DATA:  Abnormal LFT EXAM: ULTRASOUND ABDOMEN LIMITED RIGHT UPPER QUADRANT COMPARISON:  None. FINDINGS: Gallbladder: Small nonshadowing focus in the gallbladder measuring 3.6 mm. Normal wall thickness. Negative sonographic Murphy Common bile duct: Diameter: 2.5 mm Liver: Increased hepatic echogenicity. No focal hepatic abnormality. Portal vein is patent on color Doppler imaging with normal direction of blood flow towards the liver. IMPRESSION: 1. Small nonshadowing focus in the gallbladder could reflect tumefactive sludge or nonshadowing stone. Negative for acute cholecystitis or biliary dilatation 2.  Increased hepatic echogenicity suggesting steatosis or hepatocellular disease Electronically Signed   By: Jasmine Pang M.D.   On: 08/04/2017 03:18    Cardiac Studies   none  Patient Profile     62 y.o. male with Parkinson's disease, alcohol abuse, and paroxysmal atrial flutter here with atrial flutter with RVR.  He was in his usual state of health when he developed SVT with a ventricular rate in the 200s.  He did not convert with adenosine x2 or an attempt at cardioversion at 100J.  He was reportedly minimally responsive and hypotensive and converted on his own upon arrival at the hospital.  Since then he has received metoprolol and remains in sinus rhythm  Assessment & Plan    1.  Atrial flutter with rapid ventricular response (HCC)  - maintaining NSR - Currently on IV Heparin gtt.    2.  Chest pain with moderate risk for cardiac etiology - pt had negative workup 2 yr ago in [place] - Trop peaked at 2.08 and trending downward.  ? Demand ischemia in setting of afib with RVR and hypotension as well as attempted cardioversion. - he had ischemic ST changes in the inferior leads after conversion and now with T wave inversions.  No old EKG to compare - apparently had a cath in [place] 2 years ago and was told it was fine.  - 2D echo pending.   - further ischemic workup pending results of echo. - if LVF down then will need cath.  If LVF is normal then consider coronary CTA.  3. HTN - BP remains elevated.  Cannot titrate BB further due to borderline bradycardia. - consider adding ACE I or CCB.  4. Bloody stools - pt w/ GI upset, high daily ETOH use - While in the ED he had a bloody bowel movement.  Initial hemoglobin 15.7.  AST and ALT are mildly elevated.  He reports drinking at least a 6 pack daily - per IM     For questions or updates, please contact [facility] Please consult [email or web address] for contact info under Cardiology/STEMI.      Signed, Armanda Magic, MD  08/04/2017, 10:15  AM

## 2017-08-04 NOTE — Progress Notes (Signed)
ANTICOAGULATION CONSULT NOTE - Follow Up Consult  Pharmacy Consult for heparin Indication: atrial fibrillation  Labs: Recent Labs    08/03/17 1404 08/03/17 1440 08/03/17 1733  08/04/17 0311 08/04/17 0919 08/04/17 1442 08/04/17 2153  HGB 16.7 15.7  --   --  15.2  --   --   --   HCT 49.0 47.5  --   --  45.3  --   --   --   PLT  --  145*  --   --  241  --   --   --   HEPARINUNFRC  --   --   --    < > 0.10* 0.36 0.22* 0.36  CREATININE 1.10 0.95  --   --  0.92  --   --   --   CKTOTAL  --   --   --   --  229  --   --   --   CKMB  --   --   --   --  12.3*  --   --   --   TROPONINI  --   --  2.08*  --  1.96* 1.04*  --   --    < > = values in this interval not displayed.    Assessment/Plan:  62yo male therapeutic on heparin after rate change. Will continue gtt at current rate and confirm stable with am labs.   Vernard GamblesVeronda Raisha Brabender, PharmD, BCPS  08/04/2017,11:11 PM

## 2017-08-04 NOTE — Progress Notes (Signed)
PROGRESS NOTE    Brian George  ZOX:[id number] DOB: 08/22/1955 DOA: 08/03/2017 PCP: Patient, No Pcp Per   Chief Complaint  Patient presents with  . Chest Pain    Brief Narrative:  HPI on 08/03/2017 by Dr. Pearson Grippe Jemery Stacey  is a 62 y.o. male,  w c/o dull throbbing in the chest (mid) started about 11am. After finishing drinking a beer.   Pt denies fever, chills, cough, sob, heartburn, abd pain.  While he was in the EMS truck , given a couple of injections, and during the ride had slight n/v, (no blood),  diarrhea (loose stool) with some blood in there.   Assessment & Plan   Atrial flutter with RVR -Patient appears to be in sinus rhythm at this time -Cardiology consulted appreciated -Echocardiogram pending -TSH and free T4 within normal limits -Continue heparin  Chest discomfort with elevated troponin -Suspect secondary to atrial flutter -Discomfort has now subsided -Echocardiogram as above -Troponin trending downward, peaked at 2.08 -Pending echocardiogram, if left ventricular function is low, patient may need ischemic workup  Bright rectal blood per rectum -Occurred once patient was in the emergency department -Hemoglobin appears to be stable, currently 15.2 -Was discussed with gastroenterology as patient will need to be on anticoagulation given atrial flutter  Essential hypertension -Continue metoprolol -May consider adding ACEI or ARB  Abnormal LFTs -Hepatitis panel pending  -LFTS trending downward -Right upper quadrant ultrasound: Small non-shadowing focus in the gallbladder could reflect tumefactive sludge or non-shadowing stone. Negative for acute cholecystitis or biliary dilatation. Increased hepatic echogenicity suggesting steatosis or hepatocellular disease  Nausea and vomiting -Resolved, continue anti-emetics and continue to monitor  Alcohol/tobacco abuse -Discussed cessation  Diarrhea -No longer occurring -GI pathogen panel and C. difficile were  ordered, pending  DVT Prophylaxis  heparin  Code Status: Full   Family Communication: None at bedside  Disposition Plan: Admitted. Pending further recommendations from cardiology and gastroenterology  Consultants Cardiology Gastroenterology  Procedures  Echocardiogram  Antibiotics   Anti-infectives (From admission, onward)   None      Subjective:   Jordie Shumard seen and examined today.  Denies further diarrhea as he has not been able to eat anything since admission. Denies shortness of breath, chest pain. Denies further blood per rectum. Denies abdominal pain, nausea or vomiting.    Objective:   Vitals:   08/04/17 0000 08/04/17 0121 08/04/17 0518 08/04/17 0903  BP: (!) 143/106 (!) 169/121 (!) 148/110 (!) 154/110  Pulse: (!) 59 (!) 58 61 60  Resp: (!) 21 18 (!) 28   Temp:  99.5 F (37.5 C) 99.2 F (37.3 C) 99.1 F (37.3 C)  TempSrc:  Oral Oral Oral  SpO2: 100% 96% 96% 97%  Weight:  72.3 kg (159 lb 8 oz)    Height:  5\' 7"  (1.702 m)      Intake/Output Summary (Last 24 hours) at 08/04/2017 1359 Last data filed at 08/04/2017 0900 Gross per 24 hour  Intake 429.07 ml  Output 1350 ml  Net -920.93 ml   Filed Weights   08/03/17 1359 08/04/17 0121  Weight: 64 kg (141 lb) 72.3 kg (159 lb 8 oz)    Exam  General: Well developed, well nourished, NAD, appears stated age  HEENT: NCAT, mucous membranes moist.   Cardiovascular: S1 S2 auscultated, no rubs, murmurs or gallops. Regular rate and rhythm.  Respiratory: Clear to auscultation bilaterally with equal chest rise  Abdomen: Soft, nontender, nondistended, + bowel sounds  Extremities: warm dry without cyanosis clubbing  or edema  Neuro: AAOx3, nonfocal  Psych: Normal affect and demeanor with intact judgement and insight   Data Reviewed: I have personally reviewed following labs and imaging studies  CBC: Recent Labs  Lab 08/03/17 1404 08/03/17 1440 08/04/17 0311  WBC  --  4.7 7.0  NEUTROABS  --  3.2   --   HGB 16.7 15.7 15.2  HCT 49.0 47.5 45.3  MCV  --  96.5 94.4  PLT  --  145* 241   Basic Metabolic Panel: Recent Labs  Lab 08/03/17 1404 08/03/17 1440 08/04/17 0311  NA 138 139 136  K 4.0 3.6 3.6  CL 108 109 107  CO2  --  15* 21*  GLUCOSE 98 75 75  BUN 17 13 11   CREATININE 1.10 0.95 0.92  CALCIUM  --  8.7* 8.7*   GFR: Estimated Creatinine Clearance: 80.8 mL/min (by C-G formula based on SCr of 0.92 mg/dL). Liver Function Tests: Recent Labs  Lab 08/03/17 1440 08/04/17 0311  AST 79* 48*  ALT 93* 77*  ALKPHOS 51 47  BILITOT 1.0 1.7*  PROT 7.1 7.0  ALBUMIN 3.8 3.6   No results for input(s): LIPASE, AMYLASE in the last 168 hours. No results for input(s): AMMONIA in the last 168 hours. Coagulation Profile: No results for input(s): INR, PROTIME in the last 168 hours. Cardiac Enzymes: Recent Labs  Lab 08/03/17 1733 08/04/17 0311 08/04/17 0919  CKTOTAL  --  229  --   CKMB  --  12.3*  --   TROPONINI 2.08* 1.96* 1.04*   BNP (last 3 results) No results for input(s): PROBNP in the last 8760 hours. HbA1C: Recent Labs    08/04/17 0311  HGBA1C 5.2   CBG: No results for input(s): GLUCAP in the last 168 hours. Lipid Profile: Recent Labs    08/04/17 0311  CHOL 167  HDL 59  LDLCALC 99  TRIG 46  CHOLHDL 2.8   Thyroid Function Tests: Recent Labs    08/03/17 1814 08/04/17 0311  TSH 0.406 0.367  FREET4 0.87  --    Anemia Panel: No results for input(s): VITAMINB12, FOLATE, FERRITIN, TIBC, IRON, RETICCTPCT in the last 72 hours. Urine analysis: No results found for: COLORURINE, APPEARANCEUR, LABSPEC, PHURINE, GLUCOSEU, HGBUR, BILIRUBINUR, KETONESUR, PROTEINUR, UROBILINOGEN, NITRITE, LEUKOCYTESUR Sepsis Labs: @LABRCNTIP (procalcitonin:4,lacticidven:4)  )No results found for this or any previous visit (from the past 240 hour(s)).    Radiology Studies: Dg Chest Port 1 View  Result Date: 08/03/2017 CLINICAL DATA:  Weakness. EXAM: PORTABLE CHEST 1 VIEW  COMPARISON:  None. FINDINGS: Cardiomegaly identified. A 1 cm nodular opacity overlying the mid left lung is noted. There is no evidence of focal airspace disease, pulmonary edema, pleural effusion, or pneumothorax. No acute bony abnormalities are identified. IMPRESSION: 1. 1 cm nodular opacity overlying the mid left lung which may represent nipple. Recommend repeat chest radiograph with nipple markers when able. 2. Cardiomegaly without evidence of acute cardiopulmonary disease. Electronically Signed   By: Harmon Pier M.D.   On: 08/03/2017 14:04   US Abdomen Limited Ruq  Result Date: 08/04/2017 CLINICAL DATA:  Abnormal LFT EXAM: ULTRASOUND ABDOMEN LIMITED RIGHT UPPER QUADRANT COMPARISON:  None. FINDINGS: Gallbladder: Small nonshadowing focus in the gallbladder measuring 3.6 mm. Normal wall thickness. Negative sonographic Murphy Common bile duct: Diameter: 2.5 mm Liver: Increased hepatic echogenicity. No focal hepatic abnormality. Portal vein is patent on color Doppler imaging with normal direction of blood flow towards the liver. IMPRESSION: 1. Small nonshadowing focus in the gallbladder could reflect tumefactive  sludge or nonshadowing stone. Negative for acute cholecystitis or biliary dilatation 2. Increased hepatic echogenicity suggesting steatosis or hepatocellular disease Electronically Signed   By: Jasmine PangKim  Fujinaga M.D.   On: 08/04/2017 03:18     Scheduled Meds: . metoprolol tartrate  25 mg Oral BID  . nitroGLYCERIN  0.5 inch Topical Q8H  . pantoprazole (PROTONIX) IV  40 mg Intravenous Q12H   Continuous Infusions: . heparin 1,200 Units/hr (08/04/17 0359)     LOS: 1 day   Time Spent in minutes   45 minutes  Lorel Lembo D.O. on 08/04/2017 at 1:59 PM  Between 7am to 7pm - Pager - [phone]  After 7pm go to [email or web address] - password [email or web address]  And look for the night coverage person covering for me after hours  [facility] Office  [phone]

## 2017-08-04 NOTE — Progress Notes (Signed)
ANTICOAGULATION CONSULT NOTE - Follow Up Consult  Pharmacy Consult for heparin Indication: atrial fibrillation  Labs: Recent Labs    08/03/17 1404 08/03/17 1440 08/03/17 1733 08/04/17 0311  HGB 16.7 15.7  --  15.2  HCT 49.0 47.5  --  45.3  PLT  --  145*  --  241  HEPARINUNFRC  --   --   --  0.10*  CREATININE 1.10 0.95  --   --   TROPONINI  --   --  2.08*  --     Assessment: 61yo male subtherapeutic on heparin with initial dosing for Aflutter w/ RVR.  Goal of Therapy:  Heparin level 0.3-0.7 units/ml   Plan:  Will rebolus with heparin 2000 units and increase heparin gtt by 3 units/kg/hr to 1200 units/hr and check level in 6 hours.   Brian George, PharmD, BCPS  08/04/2017,3:56 AM

## 2017-08-04 NOTE — Progress Notes (Signed)
ANTICOAGULATION CONSULT NOTE  Pharmacy Consult for Heparin Indication: atrial fibrillation  Not on File  Patient Measurements: Height: $RemoveBeforeDEI _weRpVDmsXUKVPnDDVuQvMwdMOqaXwUDn$5\' 7"Calculated) : 66.1 Heparin Dosing Weight: 64 kg   Vital Signs: Temp: 99.1 F (37.3 C) (01/26 0903) Temp Source: Oral (01/26 0903) BP: 154/110 (01/26 0903) Pulse Rate: 60 (01/26 0903)  Labs: Recent Labs    08/03/17 1404 08/03/17 1440 08/03/17 1733 08/04/17 0311 08/04/17 0919  HGB 16.7 15.7  --  15.2  --   HCT 49.0 47.5  --  45.3  --   PLT  --  145*  --  241  --   HEPARINUNFRC  --   --   --  0.10* 0.36  CREATININE 1.10 0.95  --  0.92  --   CKTOTAL  --   --   --  229  --   CKMB  --   --   --  12.3*  --   TROPONINI  --   --  2.08* 1.96* 1.04*    Estimated Creatinine Clearance: 80.8 mL/min (by C-G formula based on SCr of 0.92 mg/dL).   Medical History: Past Medical History:  Diagnosis Date  . Atrial flutter with rapid ventricular response (HCC) 08/03/2017  . Chest pain with moderate risk for cardiac etiology 08/03/2017  . Parkinson's disease (HCC)     Medications:  No medications prior to admission.    Assessment: [age] YOM here with Afib w/ RVR who spontaneously converted to SR. Pharmacy consulted for IV heparin with plans to switch to Eliquis later. He is also noted with elevated troponin and plans are for Echo. Rectal bleeding noted at admission but Hg is stable.  -Heparin level is at goal  Goal of Therapy:  Heparin level 0.3-0.7 units/ml Monitor platelets by anticoagulation protocol: Yes   Plan:  -No heparin changes needed -Will confirm a heparin level later today -Daily heparin level and CBC  Harland GermanAndrew Ashtin Rosner, Pharm D 08/04/2017 12:25 PM

## 2017-08-04 NOTE — Progress Notes (Signed)
  Echocardiogram 2D Echocardiogram has been performed.  Janalyn HarderWest, Aleece Loyd R 08/04/2017, 12:29 PM

## 2017-08-05 DIAGNOSIS — R7989 Other specified abnormal findings of blood chemistry: Secondary | ICD-10-CM

## 2017-08-05 DIAGNOSIS — R748 Abnormal levels of other serum enzymes: Secondary | ICD-10-CM

## 2017-08-05 DIAGNOSIS — R778 Other specified abnormalities of plasma proteins: Secondary | ICD-10-CM

## 2017-08-05 DIAGNOSIS — R945 Abnormal results of liver function studies: Secondary | ICD-10-CM

## 2017-08-05 DIAGNOSIS — I420 Dilated cardiomyopathy: Secondary | ICD-10-CM

## 2017-08-05 DIAGNOSIS — Z791 Long term (current) use of non-steroidal anti-inflammatories (NSAID): Secondary | ICD-10-CM

## 2017-08-05 LAB — COMPREHENSIVE METABOLIC PANEL
ALT: 50 U/L (ref 17–63)
AST: 29 U/L (ref 15–41)
Albumin: 3.1 g/dL — ABNORMAL LOW (ref 3.5–5.0)
Alkaline Phosphatase: 40 U/L (ref 38–126)
Anion gap: 10 (ref 5–15)
BILIRUBIN TOTAL: 0.8 mg/dL (ref 0.3–1.2)
BUN: 11 mg/dL (ref 6–20)
CHLORIDE: 108 mmol/L (ref 101–111)
CO2: 20 mmol/L — ABNORMAL LOW (ref 22–32)
Calcium: 8.4 mg/dL — ABNORMAL LOW (ref 8.9–10.3)
Creatinine, Ser: 0.93 mg/dL (ref 0.61–1.24)
GFR calc Af Amer: >60 mL/min (ref >60)
Glucose, Bld: 79 mg/dL (ref 65–99)
POTASSIUM: 3.8 mmol/L (ref 3.5–5.1)
Sodium: 138 mmol/L (ref 135–145)
TOTAL PROTEIN: 5.9 g/dL — AB (ref 6.5–8.1)

## 2017-08-05 LAB — C DIFFICILE QUICK SCREEN W PCR REFLEX
C DIFFICILE (CDIFF) INTERP: NOT DETECTED
C DIFFICILE (CDIFF) TOXIN: NEGATIVE
C Diff antigen: NEGATIVE

## 2017-08-05 LAB — GASTROINTESTINAL PANEL BY PCR, STOOL (REPLACES STOOL CULTURE)

## 2017-08-05 LAB — OCCULT BLOOD X 1 CARD TO LAB, STOOL: FECAL OCCULT BLD: NEGATIVE

## 2017-08-05 LAB — CBC
HEMATOCRIT: 39.4 % (ref 39.0–52.0)
Hemoglobin: 13.8 g/dL (ref 13.0–17.0)
MCH: 33.1 pg (ref 26.0–34.0)
MCHC: 35 g/dL (ref 30.0–36.0)
MCV: 94.5 fL (ref 78.0–100.0)
PLATELETS: 271 10*3/uL (ref 150–400)
RBC: 4.17 MIL/uL — ABNORMAL LOW (ref 4.22–5.81)
RDW: 13.5 % (ref 11.5–15.5)
WBC: 5.6 10*3/uL (ref 4.0–10.5)

## 2017-08-05 LAB — HEPARIN LEVEL (UNFRACTIONATED): HEPARIN UNFRACTIONATED: 0.55 [IU]/mL (ref 0.30–0.70)

## 2017-08-05 MED ORDER — SODIUM CHLORIDE 0.9% FLUSH: 3.0000 mL | INTRAVENOUS | Status: DC | PRN

## 2017-08-05 MED ORDER — ASPIRIN EC 81 MG PO TBEC
81.0000 mg | DELAYED_RELEASE_TABLET | Freq: Every day | ORAL | Status: DC
  Administered 2017-08-05 – 2017-08-06 (×2): 81 mg via ORAL
  Filled 2017-08-05 (×2): qty 1

## 2017-08-05 MED ORDER — AMLODIPINE BESYLATE 5 MG PO TABS
5.0000 mg | ORAL_TABLET | Freq: Every day | ORAL | Status: DC
  Administered 2017-08-05 – 2017-08-06 (×2): 5 mg via ORAL
  Filled 2017-08-05 (×2): qty 1

## 2017-08-05 MED ORDER — SODIUM CHLORIDE 0.9% FLUSH: 3.0000 mL | Freq: Two times a day (BID) | INTRAVENOUS | Status: DC

## 2017-08-05 MED ORDER — SODIUM CHLORIDE 0.9 % IV SOLN: 250.0000 mL | INTRAVENOUS | Status: DC | PRN

## 2017-08-05 MED ORDER — SODIUM CHLORIDE 0.9 % WEIGHT BASED INFUSION
3.0000 mL/kg/h | INTRAVENOUS | Status: DC
  Administered 2017-08-06: 3 mL/kg/h via INTRAVENOUS

## 2017-08-05 MED ORDER — HYDRALAZINE HCL 20 MG/ML IJ SOLN
10.0000 mg | Freq: Four times a day (QID) | INTRAMUSCULAR | Status: DC | PRN
  Administered 2017-08-05: 10 mg via INTRAVENOUS
  Filled 2017-08-05: qty 1

## 2017-08-05 MED ORDER — ASPIRIN 81 MG PO CHEW
81.0000 mg | CHEWABLE_TABLET | ORAL | Status: AC
  Administered 2017-08-06: 81 mg via ORAL
  Filled 2017-08-05: qty 1

## 2017-08-05 MED ORDER — SODIUM CHLORIDE 0.9 % WEIGHT BASED INFUSION: 1.0000 mL/kg/h | INTRAVENOUS | Status: DC

## 2017-08-05 MED ORDER — ATORVASTATIN CALCIUM 20 MG PO TABS
20.0000 mg | ORAL_TABLET | Freq: Every day | ORAL | Status: DC
  Administered 2017-08-05: 20 mg via ORAL
  Filled 2017-08-05: qty 1

## 2017-08-05 NOTE — Consult Note (Addendum)
Gastroenterology Consultation                                               covering for Dr. Loreta Ave and Elnoria Howard   Referring Provider: [facility]   Primary Care Physician:  Patient, No Pcp Per Primary Gastroenterologist:  Gentry Fitz - covering for Drs. Mann & Elnoria Howard  Reason for Consultation:  Rectal bleeding    Attending physician's note   I have taken a history, examined the patient and reviewed the chart. I agree with the Advanced Practitioner's note, impression and recommendations. Mucous with a scant amount of blood per rectum which has not recurred. No anemia. Being treated for SVT. Currently on heparin. Recommend colonoscopy electively in a couple months when cardiac problems have stabilize or can proceed as inpatient if bleeding recurs. Drs. Mann & Elnoria Howard to assume GI care on Monday for further mgmt. GI signing off.   Claudette Head, MD [facility]    ASSESSMENT AND PLAN:    1. SVT / RVR, HR 230 upon presentation. Currently on Heparin  2. Mild, scant blood per rectum in some mucous this admission. This happened the day following episode of SVT when patient vomited and became incontinent of stool. Isolated event. No bleeding since and no hx of bleeding at home. BMs are normal. Hgb is normal.  -I don't think inpatient colonoscopy is necessary given presentation. He doesn't particularly want inpatient colonoscopy anyway. He is open to outpatient colonoscopy.   3. Mild transaminitis, not c/w with ETOH. U/S suggests steastosis. Numbers have normalized  4. Possibly cholelithiasis, asymptomatic    HPI: Brian George is a 62 y.o. male, from [place] who retired and came to [place] to be near his sisters. He has Parkinson's disease and etoh use (?abuse).  He presented to ED 08/03/17 with chest pain. Was was SVT, HR was 220. In route he apparently had an episode of vomiting and diarrhea .  There is mention of abdominal pain in the notes. Initial hgb 16.7,  down to 15.2 yesterday and 13.8 today but he did get IV fluids yesterday. MCV in 90s.   Mr. Fann is very pleasant but seemed a little agitated when I asked him about rectal bleeding and abdominal pain. No say that he has had NO abdominal pain whatsoever. Either because of the recent cardiac event or because of the medication they gave me patient totally emptied out his stomach / intestines. His bowels are completely normal at home. As far as the blood, the day after patient had the episode of vomiting and incontinence he passes a small piece of mucus with a streak of blood. This has been the only time that he has ever seen any blood in his stools. He is concerned about his health and would tell someone if there was blood in stool on regular basis. He has never had a colonoscopy. No FMH of colon cancer. Patient has no chronic GI complaints. His main complaint right now is making sure he never has another cardiac event. He had an episode of tachycardia in [place] a couple of years ago.    Past Medical History:  Diagnosis Date  . Atrial flutter with rapid ventricular response (HCC) 08/03/2017  . Chest pain with moderate risk for cardiac etiology 08/03/2017  . Parkinson's disease (HCC)     History reviewed. No pertinent surgical history.  Prior to Admission medications  Not on File    Current Facility-Administered Medications  Medication Dose Route Frequency Provider Last Rate Last Dose  . aspirin EC tablet 81 mg  81 mg Oral Daily Turner, Traci R, MD      . atorvastatin (LIPITOR) tablet 20 mg  20 mg Oral q1800 Turner, Traci R, MD      . heparin ADULT infusion 100 units/mL (25000 units/23450mL sodium chloride 0.45%)  1,350 Units/hr Intravenous Continuous Adline PotterDunham, Sabrina, RPH 13.5 mL/hr at 08/04/17 1623 1,350 Units/hr at 08/04/17 1623  . metoprolol tartrate (LOPRESSOR) tablet 25 mg  25 mg Oral BID Barrett, Rhonda G, PA-C   25 mg at 08/05/17 [id number]  . nitroGLYCERIN (NITROGLYN) 2 % ointment 0.5 inch  0.5  inch Topical Q8H Pearson GrippeKim, James, MD   Stopped at 08/03/17 2104  . ondansetron (ZOFRAN) injection 4 mg  4 mg Intravenous Q6H PRN Pearson GrippeKim, James, MD      . pantoprazole (PROTONIX) injection 40 mg  40 mg Intravenous Delories HeinzQ12H Kim, James, MD   40 mg at 08/05/17 [id number]    Allergies as of 08/03/2017  . (Not on File)    Family History  Problem Relation Age of Onset  . Heart defect Father [id number]       Congenital heart disease  . Heart defect Brother 38       Congenital heart disease    Social History   Socioeconomic History  . Marital status: Single    Spouse name: Not on file  . Number of children: Not on file  . Years of education: Not on file  . Highest education level: Not on file  Social Needs  . Financial resource strain: Not on file  . Food insecurity - worry: Not on file  . Food insecurity - inability: Not on file  . Transportation needs - medical: Not on file  . Transportation needs - non-medical: Not on file  Occupational History  . 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 Smoking status: Current Every Day Smoker    Packs/day: 0.50    Types: Cigarettes  . Smokeless tobacco: Never Used  Substance and Sexual Activity  . Alcohol use: Yes    Alcohol/week: 25.2 oz    Types: 42 Cans of beer per week    Comment: six-pack per day  . Drug use: No  . Sexual activity: Not on file  Other Topics Concern  . Not on file  Social History Narrative   Pt moved to [place] 03/2017 and lives with his sisters.     Review of Systems: All systems reviewed and negative except where noted in HPI.  Physical Exam: Vital signs in last 24 hours: Temp:  [97.7 F (36.5 C)-99.4 F (37.4 C)] 97.7 F (36.5 C) (01/27 0849) Pulse Rate:  [53-61] 61 (01/27 0922) Resp:  [19-27] 25 (01/27 0849) BP: (153-158)/(96-120) 155/96 (01/27 0849) SpO2:  [96 %-99 %] 96 % (01/27 0849) Last BM Date: 08/05/17 General:   Alert, well-developed, black male in NAD Psych:  Pleasant, cooperative. Normal  mood and affect. Eyes:  Pupils equal, sclera clear, no icterus.   Conjunctiva pink. Ears:  Normal auditory acuity. Nose:  No deformity, discharge,  or lesions. Neck:  Supple; no masses Lungs:  Clear throughout to auscultation.   No wheezes, crackles, or rhonchi.  Heart:  Regular rate and rhythm; no murmurs, no edema Abdomen:  Soft, non-distended, nontender, BS active, no palp mass    Rectal:  Deferred  Msk:  Symmetrical without gross  deformities. . Neurologic:  Alert and  oriented x4;  grossly normal neurologically. Skin:  Intact without significant lesions or rashes..   Intake/Output from previous day: 01/26 0701 - 01/27 0700 In: 890 [P.O.:720; I.V.:170] Out: 900 [Urine:900] Intake/Output this shift: Total I/O In: 240 [P.O.:240] Out: 150 [Urine:150]  Lab Results: Recent Labs    08/03/17 1440 08/04/17 0311 08/05/17 0254  WBC 4.7 7.0 5.6  HGB 15.7 15.2 13.8  HCT 47.5 45.3 39.4  PLT 145* 241 271   BMET Recent Labs    08/03/17 1440 08/04/17 0311 08/05/17 0254  NA 139 136 138  K 3.6 3.6 3.8  CL 109 107 108  CO2 15* 21* 20*  GLUCOSE 75 75 79  BUN 13 11 11   CREATININE 0.95 0.92 0.93  CALCIUM 8.7* 8.7* 8.4*   LFT Recent Labs    08/05/17 0254  PROT 5.9*  ALBUMIN 3.1*  AST 29  ALT 50  ALKPHOS 40  BILITOT 0.8    Studies/Results: Dg Chest Port 1 View  Result Date: 08/03/2017 CLINICAL DATA:  Weakness. EXAM: PORTABLE CHEST 1 VIEW COMPARISON:  None. FINDINGS: Cardiomegaly identified. A 1 cm nodular opacity overlying the mid left lung is noted. There is no evidence of focal airspace disease, pulmonary edema, pleural effusion, or pneumothorax. No acute bony abnormalities are identified. IMPRESSION: 1. 1 cm nodular opacity overlying the mid left lung which may represent nipple. Recommend repeat chest radiograph with nipple markers when able. 2. Cardiomegaly without evidence of acute cardiopulmonary disease. Electronically Signed   By: Harmon Pier M.D.   On: 08/03/2017  14:04   US Abdomen Limited Ruq  Result Date: 08/04/2017 CLINICAL DATA:  Abnormal LFT EXAM: ULTRASOUND ABDOMEN LIMITED RIGHT UPPER QUADRANT COMPARISON:  None. FINDINGS: Gallbladder: Small nonshadowing focus in the gallbladder measuring 3.6 mm. Normal wall thickness. Negative sonographic Murphy Common bile duct: Diameter: 2.5 mm Liver: Increased hepatic echogenicity. No focal hepatic abnormality. Portal vein is patent on color Doppler imaging with normal direction of blood flow towards the liver. IMPRESSION: 1. Small nonshadowing focus in the gallbladder could reflect tumefactive sludge or nonshadowing stone. Negative for acute cholecystitis or biliary dilatation 2. Increased hepatic echogenicity suggesting steatosis or hepatocellular disease Electronically Signed   By: Jasmine Pang M.D.   On: 08/04/2017 03:18    Willette Cluster, NP-C @  08/05/2017, 11:38 AM Pager number [phone]

## 2017-08-05 NOTE — Progress Notes (Addendum)
Progress Note  Patient Name: Brian George Noori Date of Encounter: 08/05/2017  Primary Cardiologist: No primary care provider on file.   Subjective   Denies any CP or SOB.  Remains in NSR  Inpatient Medications    Scheduled Meds: . metoprolol tartrate  25 mg Oral BID  . nitroGLYCERIN  0.5 inch Topical Q8H  . pantoprazole (PROTONIX) IV  40 mg Intravenous Q12H   Continuous Infusions: . heparin 1,350 Units/hr (08/04/17 1623)   PRN Meds: ondansetron (ZOFRAN) IV   Vital Signs    Vitals:   08/05/17 0001 08/05/17 0407 08/05/17 0849 08/05/17 0922  BP: (!) 155/120 (!) 158/105 (!) 155/96   Pulse: (!) 57 (!) 54 (!) 53 61  Resp: 19 19 (!) 25   Temp: 99.4 F (37.4 C) 97.7 F (36.5 C) 97.7 F (36.5 C)   TempSrc: Oral Oral Oral   SpO2: 99% 98% 96%   Weight:      Height:        Intake/Output Summary (Last 24 hours) at 08/05/2017 1103 Last data filed at 08/05/2017 0850 Gross per 24 hour  Intake 890 ml  Output 1050 ml  Net -160 ml   Filed Weights   08/03/17 1359 08/04/17 0121  Weight: 141 lb (64 kg) 159 lb 8 oz (72.3 kg)    Telemetry    NSR - Personally Reviewed  ECG    No new EKG to review - Personally Reviewed  Physical Exam   GEN: No acute distress.   Neck: No JVD Cardiac: RRR, no murmurs, rubs, or gallops.  Respiratory: Clear to auscultation bilaterally. GI: Soft, nontender, non-distended  MS: No edema; No deformity. Neuro:  Nonfocal  Psych: Normal affect   Labs    Chemistry Recent Labs  Lab 08/03/17 1440 08/04/17 0311 08/05/17 0254  NA 139 136 138  K 3.6 3.6 3.8  CL 109 107 108  CO2 15* 21* 20*  GLUCOSE 75 75 79  BUN 13 11 11   CREATININE 0.95 0.92 0.93  CALCIUM 8.7* 8.7* 8.4*  PROT 7.1 7.0 5.9*  ALBUMIN 3.8 3.6 3.1*  AST 79* 48* 29  ALT 93* 77* 50  ALKPHOS 51 47 40  BILITOT 1.0 1.7* 0.8  GFRNONAA >60 >60 >60  GFRAA >60 >60 >60  ANIONGAP 15 8 10      Hematology Recent Labs  Lab 08/03/17 1440 08/04/17 0311 08/05/17 0254  WBC 4.7  7.0 5.6  RBC 4.92 4.80 4.17*  HGB 15.7 15.2 13.8  HCT 47.5 45.3 39.4  MCV 96.5 94.4 94.5  MCH 31.9 31.7 33.1  MCHC 33.1 33.6 35.0  RDW 13.5 13.6 13.5  PLT 145* 241 271    Cardiac Enzymes Recent Labs  Lab 08/03/17 1733 08/04/17 0311 08/04/17 0919  TROPONINI 2.08* 1.96* 1.04*    Recent Labs  Lab 08/03/17 1403  TROPIPOC 0.05     BNPNo results for input(s): BNP, PROBNP in the last 168 hours.   DDimer No results for input(s): DDIMER in the last 168 hours.   Radiology    Dg Chest Port 1 View  Result Date: 08/03/2017 CLINICAL DATA:  Weakness. EXAM: PORTABLE CHEST 1 VIEW COMPARISON:  None. FINDINGS: Cardiomegaly identified. A 1 cm nodular opacity overlying the mid left lung is noted. There is no evidence of focal airspace disease, pulmonary edema, pleural effusion, or pneumothorax. No acute bony abnormalities are identified. IMPRESSION: 1. 1 cm nodular opacity overlying the mid left lung which may represent nipple. Recommend repeat chest radiograph with nipple markers when able.  2. Cardiomegaly without evidence of acute cardiopulmonary disease. Electronically Signed   By: Harmon Pier M.D.   On: 08/03/2017 14:04   US Abdomen Limited Ruq  Result Date: 08/04/2017 CLINICAL DATA:  Abnormal LFT EXAM: ULTRASOUND ABDOMEN LIMITED RIGHT UPPER QUADRANT COMPARISON:  None. FINDINGS: Gallbladder: Small nonshadowing focus in the gallbladder measuring 3.6 mm. Normal wall thickness. Negative sonographic Murphy Common bile duct: Diameter: 2.5 mm Liver: Increased hepatic echogenicity. No focal hepatic abnormality. Portal vein is patent on color Doppler imaging with normal direction of blood flow towards the liver. IMPRESSION: 1. Small nonshadowing focus in the gallbladder could reflect tumefactive sludge or nonshadowing stone. Negative for acute cholecystitis or biliary dilatation 2. Increased hepatic echogenicity suggesting steatosis or hepatocellular disease Electronically Signed   By: Jasmine Pang M.D.    On: 08/04/2017 03:18    Cardiac Studies   2D echo 08/04/2017 Study Conclusions  - Left ventricle: The cavity size was mildly dilated. There was   mild concentric hypertrophy. Systolic function was mildly   reduced. The estimated ejection fraction was in the range of 45%   to 50%. There is hypokinesis of the inferolateral and inferior   myocardium. - Aortic valve: Trileaflet; mildly thickened, mildly calcified   leaflets. - Aorta: Aortic root dimension: 39 mm (ED). Ascending aortic   diameter: 38 mm (S). - Aortic root: The aortic root was mildly dilated. - Ascending aorta: The ascending aorta was mildly dilated. - Mitral valve: There was trivial regurgitation. - Atrial septum: There was increased thickness of the septum,   consistent with lipomatous hypertrophy.  Patient Profile     62 y.o. male with Parkinson's disease, alcohol abuse, and paroxysmal atrial flutter here with atrial flutter with RVR. He was in his usual state of health when he developed SVT with a ventricular rate in the 200s. He did not convert with adenosine x2 or an attempt at cardioversion at 100J. He was reportedly minimally responsive and hypotensive and converted on his own upon arrival at the hospital. Since then he has received metoprolol and remains in sinus rhythm  Assessment & Plan    1.  Atrial flutter with rapid ventricular response (HCC)  - maintaining NSR - Currently on IV Heparin gtt.   - transition to NOAC after cath.  2.  Chest pain with moderate risk for cardiac etiology - pt had negative workup 2 yr ago in [place] - Trop peaked at 2.08 and trending downward.  ? Demand ischemia in setting of afib with RVR and hypotension as well as attempted cardioversion. - he had ischemic ST changes in the inferior leads after conversion and now with T wave inversions.  No old EKG to compare.  - apparently had a cath in [place] 2 years ago and was told it was fine.  - 2D echo yesterday showed reduced LVF with  inferior and inferolateral HK - given new wall motion abnormality and elevated troponin will make NPO after MN for cath in am.  - Cardiac catheterization was discussed with the patient fully. The patient understands that risks include but are not limited to stroke (1 in 1000), death (1 in 1000), kidney failure [usually temporary] (1 in 500), bleeding (1 in 200), allergic reaction [possibly serious] (1 in 200).  The patient understands and is willing to proceed.   - ASA 81mg  daily, IV Heparin gtt, BB. - given history of ETOH and midly elevated LFTs on admit which have since normalized, start statin at lower dose with atorvastatin 20mg  daily.   -  LDL 99 this admit.  3. HTN - BP remains elevated.  Cannot titrate BB further due to borderline bradycardia. - start amlodipine 5mg  daily.   4. Bloody stools - pt w/ GI upset, high daily ETOH use - While in the ED he had a bloody bowel movement. Initial hemoglobin 15.7. AST and ALT are mildly elevated. He reports drinking at least a 6 pack daily - per IM - all stools have been occult negative   For questions or updates, please contact [facility] Please consult [email or web address] for contact info under Cardiology/STEMI.      Signed, Armanda Magic, MD  08/05/2017, 11:03 AM

## 2017-08-05 NOTE — Progress Notes (Signed)
Witnessed patient returning to bed from the bathroom unhooked from his IV. Patient has been unhooking himself from his IV to go to the bathroom without asking staff for assistance first. Educated patient on the medically necessity for heparin drip and why unhooking himself from the IV is interrupting his care and potentially dangerous. Educated patient to call for staff to assist patient with IV pole when ambulating to the bathroom.   Versie StarksHanna  Nadia Viar, RN

## 2017-08-05 NOTE — Progress Notes (Signed)
PROGRESS NOTE    Ciaran Begay  ZHY:[id number] DOB: Sep 14, 1955 DOA: 08/03/2017 PCP: Patient, No Pcp Per   Chief Complaint  Patient presents with  . Chest Pain    Brief Narrative:  HPI on 08/03/2017 by Dr. Pearson Grippe Avon Mergenthaler  is a 62 y.o. male,  w c/o dull throbbing in the chest (mid) started about 11am. After finishing drinking a beer.   Pt denies fever, chills, cough, sob, heartburn, abd pain.  While he was in the EMS truck , given a couple of injections, and during the ride had slight n/v, (no blood),  diarrhea (loose stool) with some blood in there.   Assessment & Plan   Atrial flutter with RVR -Patient appears to be in sinus rhythm at this time -Cardiology consulted appreciated -Echocardiogram EF 45-50%. Hypokinesis of inferolateral and inferior myocardium  -TSH and free T4 within normal limits -Continue heparin- will likely need oral anticoagulation   Chest discomfort with elevated troponin -Suspect secondary to atrial flutter -No longer having chest discomfort -Echocardiogram as above -Troponin trending downward, peaked at 2.08  Bright rectal blood per rectum -Occurred once patient was in the emergency department -Hemoglobin appears to be stable, currently 13.8 -Gastroenterology consulted and appreicated as patient will need to be on anticoagulation given atrial flutter -patient does admit to taking ibuprofen occasionally -per RN, patient had bloody mucous per rectum yesterday (08/04/2017)  Essential hypertension -Continue metoprolol -May consider adding ACEI or ARB  Abnormal LFTs -Hepatitis panel pending  -LFTS trending downward -Right upper quadrant ultrasound: Small non-shadowing focus in the gallbladder could reflect tumefactive sludge or non-shadowing stone. Negative for acute cholecystitis or biliary dilatation. Increased hepatic echogenicity suggesting steatosis or hepatocellular disease  Nausea and vomiting -Resolved, continue anti-emetics and continue  to monitor  Alcohol/tobacco abuse -Discussed cessation  Diarrhea -No longer occurring -C diff negative -GI pathogen panel pending -no further bowel movements, however as above, patient had bloody mucous yesterday  DVT Prophylaxis  heparin  Code Status: Full   Family Communication: None at bedside  Disposition Plan: Admitted. Pending further recommendations from cardiology and gastroenterology. Home upon discharge  Consultants Cardiology Gastroenterology  Procedures  Echocardiogram  Antibiotics   Anti-infectives (From admission, onward)   None      Subjective:   Yordi Singley seen and examined today.  Nice further chest pain, bowel movements. Denies current shortness of breath, dizziness, headache, abdominal pain, nausea, vomiting.  Would like to go home today.  Objective:   Vitals:   08/05/17 0001 08/05/17 0407 08/05/17 0849 08/05/17 0922  BP: (!) 155/120 (!) 158/105 (!) 155/96   Pulse: (!) 57 (!) 54 (!) 53 61  Resp: 19 19 (!) 25   Temp: 99.4 F (37.4 C) 97.7 F (36.5 C) 97.7 F (36.5 C)   TempSrc: Oral Oral Oral   SpO2: 99% 98% 96%   Weight:      Height:        Intake/Output Summary (Last 24 hours) at 08/05/2017 1029 Last data filed at 08/05/2017 0850 Gross per 24 hour  Intake 890 ml  Output 1050 ml  Net -160 ml   Filed Weights   08/03/17 1359 08/04/17 0121  Weight: 64 kg (141 lb) 72.3 kg (159 lb 8 oz)    Exam  General: Well developed, well nourished, NAD, appears stated age  HEENT: NCAT, mucous membranes moist.   Cardiovascular: S1 S2 auscultated, RRR, no murmur  Respiratory: Clear to auscultation bilaterally with equal chest rise, no wheezing  Abdomen: Soft, nontender, nondistended, +  bowel sounds  Extremities: warm dry without cyanosis clubbing or edema  Neuro: AAOx3, nonfocal  Psych: appropriate mood and affect   Data Reviewed: I have personally reviewed following labs and imaging studies  CBC: Recent Labs  Lab 08/03/17 1404  08/03/17 1440 08/04/17 0311 08/05/17 0254  WBC  --  4.7 7.0 5.6  NEUTROABS  --  3.2  --   --   HGB 16.7 15.7 15.2 13.8  HCT 49.0 47.5 45.3 39.4  MCV  --  96.5 94.4 94.5  PLT  --  145* 241 271   Basic Metabolic Panel: Recent Labs  Lab 08/03/17 1404 08/03/17 1440 08/04/17 0311 08/05/17 0254  NA 138 139 136 138  K 4.0 3.6 3.6 3.8  CL 108 109 107 108  CO2  --  15* 21* 20*  GLUCOSE 98 75 75 79  BUN 17 13 11 11   CREATININE 1.10 0.95 0.92 0.93  CALCIUM  --  8.7* 8.7* 8.4*   GFR: Estimated Creatinine Clearance: 80 mL/min (by C-G formula based on SCr of 0.93 mg/dL). Liver Function Tests: Recent Labs  Lab 08/03/17 1440 08/04/17 0311 08/05/17 0254  AST 79* 48* 29  ALT 93* 77* 50  ALKPHOS 51 47 40  BILITOT 1.0 1.7* 0.8  PROT 7.1 7.0 5.9*  ALBUMIN 3.8 3.6 3.1*   No results for input(s): LIPASE, AMYLASE in the last 168 hours. No results for input(s): AMMONIA in the last 168 hours. Coagulation Profile: No results for input(s): INR, PROTIME in the last 168 hours. Cardiac Enzymes: Recent Labs  Lab 08/03/17 1733 08/04/17 0311 08/04/17 0919  CKTOTAL  --  229  --   CKMB  --  12.3*  --   TROPONINI 2.08* 1.96* 1.04*   BNP (last 3 results) No results for input(s): PROBNP in the last 8760 hours. HbA1C: Recent Labs    08/04/17 0311  HGBA1C 5.2   CBG: No results for input(s): GLUCAP in the last 168 hours. Lipid Profile: Recent Labs    08/04/17 0311  CHOL 167  HDL 59  LDLCALC 99  TRIG 46  CHOLHDL 2.8   Thyroid Function Tests: Recent Labs    08/03/17 1814 08/04/17 0311  TSH 0.406 0.367  FREET4 0.87  --    Anemia Panel: No results for input(s): VITAMINB12, FOLATE, FERRITIN, TIBC, IRON, RETICCTPCT in the last 72 hours. Urine analysis: No results found for: COLORURINE, APPEARANCEUR, LABSPEC, PHURINE, GLUCOSEU, HGBUR, BILIRUBINUR, KETONESUR, PROTEINUR, UROBILINOGEN, NITRITE, LEUKOCYTESUR Sepsis Labs: @LABRCNTIP (procalcitonin:4,lacticidven:4)  )No results  found for this or any previous visit (from the past 240 hour(s)).    Radiology Studies: Dg Chest Port 1 View  Result Date: 08/03/2017 CLINICAL DATA:  Weakness. EXAM: PORTABLE CHEST 1 VIEW COMPARISON:  None. FINDINGS: Cardiomegaly identified. A 1 cm nodular opacity overlying the mid left lung is noted. There is no evidence of focal airspace disease, pulmonary edema, pleural effusion, or pneumothorax. No acute bony abnormalities are identified. IMPRESSION: 1. 1 cm nodular opacity overlying the mid left lung which may represent nipple. Recommend repeat chest radiograph with nipple markers when able. 2. Cardiomegaly without evidence of acute cardiopulmonary disease. Electronically Signed   By: Harmon PierJeffrey  Hu M.D.   On: 08/03/2017 14:04   [place] Abdomen Limited Ruq  Result Date: 08/04/2017 CLINICAL DATA:  Abnormal LFT EXAM: ULTRASOUND ABDOMEN LIMITED RIGHT UPPER QUADRANT COMPARISON:  None. FINDINGS: Gallbladder: Small nonshadowing focus in the gallbladder measuring 3.6 mm. Normal wall thickness. Negative sonographic Murphy Common bile duct: Diameter: 2.5 mm Liver: Increased hepatic echogenicity.  No focal hepatic abnormality. Portal vein is patent on color Doppler imaging with normal direction of blood flow towards the liver. IMPRESSION: 1. Small nonshadowing focus in the gallbladder could reflect tumefactive sludge or nonshadowing stone. Negative for acute cholecystitis or biliary dilatation 2. Increased hepatic echogenicity suggesting steatosis or hepatocellular disease Electronically Signed   By: Jasmine Pang M.D.   On: 08/04/2017 03:18     Scheduled Meds: . metoprolol tartrate  25 mg Oral BID  . nitroGLYCERIN  0.5 inch Topical Q8H  . pantoprazole (PROTONIX) IV  40 mg Intravenous Q12H   Continuous Infusions: . heparin 1,350 Units/hr (08/04/17 1623)     LOS: 2 days   Time Spent in minutes   30 minutes  Holden Draughon D.O. on 08/05/2017 at 10:29 AM  Between 7am to 7pm - Pager -  [phone]  After 7pm go to [email or web address] - password [email or web address]  And look for the night coverage person covering for me after hours  [facility] Office  [phone]

## 2017-08-05 NOTE — Progress Notes (Signed)
ANTICOAGULATION CONSULT NOTE  Pharmacy Consult for Heparin Indication: atrial fibrillation  Not on File  Patient Measurements: Height: 5\' 7"  (170.2 cm) Weight: 159 lb 8 oz (72.3 kg) IBW/kg (Calculated) : 66.1 Heparin Dosing Weight: 72.3 kg   Vital Signs: Temp: 97.7 F (36.5 C) (01/27 0849) Temp Source: Oral (01/27 0849) BP: 155/96 (01/27 0849) Pulse Rate: 61 (01/27 0922)  Labs: Recent Labs    08/03/17 1440 08/03/17 1733  08/04/17 0311 08/04/17 0919 08/04/17 1442 08/04/17 2153 08/05/17 0254  HGB 15.7  --   --  15.2  --   --   --  13.8  HCT 47.5  --   --  45.3  --   --   --  39.4  PLT 145*  --   --  241  --   --   --  271  HEPARINUNFRC  --   --    < > 0.10* 0.36 0.22* 0.36 0.55  CREATININE 0.95  --   --  0.92  --   --   --  0.93  CKTOTAL  --   --   --  229  --   --   --   --   CKMB  --   --   --  12.3*  --   --   --   --   TROPONINI  --  2.08*  --  1.96* 1.04*  --   --   --    < > = values in this interval not displayed.    Estimated Creatinine Clearance: 80 mL/min (by C-G formula based on SCr of 0.93 mg/dL).   Medical History: Past Medical History:  Diagnosis Date  . Atrial flutter with rapid ventricular response (HCC) 08/03/2017  . Chest pain with moderate risk for cardiac etiology 08/03/2017  . Parkinson's disease (HCC)     Medications:  No medications prior to admission.    Assessment: [age] YOM here with Afib w/ RVR who spontaneously converted to SR. Pharmacy consulted for IV heparin with plans to switch to Eliquis later. He is also noted with elevated troponin and plans are for cath. Rectal bleeding noted at admission but Hg is stable.  -Heparin level is at goal  Goal of Therapy:  Heparin level 0.3-0.7 units/ml Monitor platelets by anticoagulation protocol: Yes   Plan:  -No heparin changes needed -Daily heparin level and CBC  Harland GermanAndrew Jhoselin Crume, Pharm D 08/05/2017 12:13 PM

## 2017-08-05 NOTE — H&P (View-Only) (Signed)
 Progress Note  Patient Name: Brian George Date of Encounter: 08/05/2017  Primary Cardiologist: No primary care provider on file.   Subjective   Denies any CP or SOB.  Remains in NSR  Inpatient Medications    Scheduled Meds: . metoprolol tartrate  25 mg Oral BID  . nitroGLYCERIN  0.5 inch Topical Q8H  . pantoprazole (PROTONIX) IV  40 mg Intravenous Q12H   Continuous Infusions: . heparin 1,350 Units/hr (08/04/17 1623)   PRN Meds: ondansetron (ZOFRAN) IV   Vital Signs    Vitals:   08/05/17 0001 08/05/17 0407 08/05/17 0849 08/05/17 0922  BP: (!) 155/120 (!) 158/105 (!) 155/96   Pulse: (!) 57 (!) 54 (!) 53 61  Resp: 19 19 (!) 25   Temp: 99.4 F (37.4 C) 97.7 F (36.5 C) 97.7 F (36.5 C)   TempSrc: Oral Oral Oral   SpO2: 99% 98% 96%   Weight:      Height:        Intake/Output Summary (Last 24 hours) at 08/05/2017 1103 Last data filed at 08/05/2017 0850 Gross per 24 hour  Intake 890 ml  Output 1050 ml  Net -160 ml   Filed Weights   08/03/17 1359 08/04/17 0121  Weight: 141 lb (64 kg) 159 lb 8 oz (72.3 kg)    Telemetry    NSR - Personally Reviewed  ECG    No new EKG to review - Personally Reviewed  Physical Exam   GEN: No acute distress.   Neck: No JVD Cardiac: RRR, no murmurs, rubs, or gallops.  Respiratory: Clear to auscultation bilaterally. GI: Soft, nontender, non-distended  MS: No edema; No deformity. Neuro:  Nonfocal  Psych: Normal affect   Labs    Chemistry Recent Labs  Lab 08/03/17 1440 08/04/17 0311 08/05/17 0254  NA 139 136 138  K 3.6 3.6 3.8  CL 109 107 108  CO2 15* 21* 20*  GLUCOSE 75 75 79  BUN 13 11 11  CREATININE 0.95 0.92 0.93  CALCIUM 8.7* 8.7* 8.4*  PROT 7.1 7.0 5.9*  ALBUMIN 3.8 3.6 3.1*  AST 79* 48* 29  ALT 93* 77* 50  ALKPHOS 51 47 40  BILITOT 1.0 1.7* 0.8  GFRNONAA >60 >60 >60  GFRAA >60 >60 >60  ANIONGAP 15 8 10     Hematology Recent Labs  Lab 08/03/17 1440 08/04/17 0311 08/05/17 0254  WBC 4.7  7.0 5.6  RBC 4.92 4.80 4.17*  HGB 15.7 15.2 13.8  HCT 47.5 45.3 39.4  MCV 96.5 94.4 94.5  MCH 31.9 31.7 33.1  MCHC 33.1 33.6 35.0  RDW 13.5 13.6 13.5  PLT 145* 241 271    Cardiac Enzymes Recent Labs  Lab 08/03/17 1733 08/04/17 0311 08/04/17 0919  TROPONINI 2.08* 1.96* 1.04*    Recent Labs  Lab 08/03/17 1403  TROPIPOC 0.05     BNPNo results for input(s): BNP, PROBNP in the last 168 hours.   DDimer No results for input(s): DDIMER in the last 168 hours.   Radiology    Dg Chest Port 1 View  Result Date: 08/03/2017 CLINICAL DATA:  Weakness. EXAM: PORTABLE CHEST 1 VIEW COMPARISON:  None. FINDINGS: Cardiomegaly identified. A 1 cm nodular opacity overlying the mid left lung is noted. There is no evidence of focal airspace disease, pulmonary edema, pleural effusion, or pneumothorax. No acute bony abnormalities are identified. IMPRESSION: 1. 1 cm nodular opacity overlying the mid left lung which may represent nipple. Recommend repeat chest radiograph with nipple markers when able.   2. Cardiomegaly without evidence of acute cardiopulmonary disease. Electronically Signed   By: Jeffrey  Hu M.D.   On: 08/03/2017 14:04   Us Abdomen Limited Ruq  Result Date: 08/04/2017 CLINICAL DATA:  Abnormal LFT EXAM: ULTRASOUND ABDOMEN LIMITED RIGHT UPPER QUADRANT COMPARISON:  None. FINDINGS: Gallbladder: Small nonshadowing focus in the gallbladder measuring 3.6 mm. Normal wall thickness. Negative sonographic Murphy Common bile duct: Diameter: 2.5 mm Liver: Increased hepatic echogenicity. No focal hepatic abnormality. Portal vein is patent on color Doppler imaging with normal direction of blood flow towards the liver. IMPRESSION: 1. Small nonshadowing focus in the gallbladder could reflect tumefactive sludge or nonshadowing stone. Negative for acute cholecystitis or biliary dilatation 2. Increased hepatic echogenicity suggesting steatosis or hepatocellular disease Electronically Signed   By: Kim  Fujinaga M.D.    On: 08/04/2017 03:18    Cardiac Studies   2D echo 08/04/2017 Study Conclusions  - Left ventricle: The cavity size was mildly dilated. There was   mild concentric hypertrophy. Systolic function was mildly   reduced. The estimated ejection fraction was in the range of 45%   to 50%. There is hypokinesis of the inferolateral and inferior   myocardium. - Aortic valve: Trileaflet; mildly thickened, mildly calcified   leaflets. - Aorta: Aortic root dimension: 39 mm (ED). Ascending aortic   diameter: 38 mm (S). - Aortic root: The aortic root was mildly dilated. - Ascending aorta: The ascending aorta was mildly dilated. - Mitral valve: There was trivial regurgitation. - Atrial septum: There was increased thickness of the septum,   consistent with lipomatous hypertrophy.  Patient Profile     61 y.o. male with Parkinson's disease, alcohol abuse, and paroxysmal atrial flutter here with atrial flutter with RVR. He was in his usual state of health when he developed SVT with a ventricular rate in the 200s. He did not convert with adenosine x2 or an attempt at cardioversion at 100J. He was reportedly minimally responsive and hypotensive and converted on his own upon arrival at the hospital. Since then he has received metoprolol and remains in sinus rhythm  Assessment & Plan    1.  Atrial flutter with rapid ventricular response (HCC)  - maintaining NSR - Currently on IV Heparin gtt.   - transition to NOAC after cath.  2.  Chest pain with moderate risk for cardiac etiology - pt had negative workup 2 yr ago in [place] - Trop peaked at 2.08 and trending downward.  ? Demand ischemia in setting of afib with RVR and hypotension as well as attempted cardioversion. - he had ischemic ST changes in the inferior leads after conversion and now with T wave inversions.  No old EKG to compare.  - apparently had a cath in [place] 2 years ago and was told it was fine.  - 2D echo yesterday showed reduced LVF with  inferior and inferolateral HK - given new wall motion abnormality and elevated troponin will make NPO after MN for cath in am.  - Cardiac catheterization was discussed with the patient fully. The patient understands that risks include but are not limited to stroke (1 in 1000), death (1 in 1000), kidney failure [usually temporary] (1 in 500), bleeding (1 in 200), allergic reaction [possibly serious] (1 in 200).  The patient understands and is willing to proceed.   - ASA 81mg daily, IV Heparin gtt, BB. - given history of ETOH and midly elevated LFTs on admit which have since normalized, start statin at lower dose with atorvastatin 20mg daily.   -   LDL 99 this admit.  3. HTN - BP remains elevated.  Cannot titrate BB further due to borderline bradycardia. - start amlodipine 5mg daily.   4. Bloody stools - pt w/ GI upset, high daily ETOH use - While in the ED he had a bloody bowel movement. Initial hemoglobin 15.7. AST and ALT are mildly elevated. He reports drinking at least a 6 pack daily - per IM - all stools have been occult negative   For questions or updates, please contact [facility] Please consult [email or web address] for contact info under Cardiology/STEMI.      Signed, Lakeith Careaga, MD  08/05/2017, 11:03 AM   

## 2017-08-06 ENCOUNTER — Telehealth: Payer: Self-pay | Admitting: Cardiovascular Disease

## 2017-08-06 ENCOUNTER — Encounter (HOSPITAL_COMMUNITY)
Admission: EM | Disposition: A | Discharge status: Home / Self Care | Payer: Self-pay | Source: Home / Self Care | Attending: Internal Medicine

## 2017-08-06 ENCOUNTER — Encounter (HOSPITAL_COMMUNITY): Payer: Self-pay | Admitting: Interventional Cardiology

## 2017-08-06 DIAGNOSIS — I471 Supraventricular tachycardia: Secondary | ICD-10-CM

## 2017-08-06 DIAGNOSIS — I248 Other forms of acute ischemic heart disease: Secondary | ICD-10-CM

## 2017-08-06 HISTORY — PX: LEFT HEART CATH AND CORONARY ANGIOGRAPHY: CATH118249

## 2017-08-06 LAB — HEPARIN LEVEL (UNFRACTIONATED)
Heparin Unfractionated: 0.81 IU/mL — ABNORMAL HIGH (ref 0.30–0.70)
Heparin Unfractionated: 0.43 IU/mL (ref 0.30–0.70)

## 2017-08-06 LAB — BASIC METABOLIC PANEL
ANION GAP: 10 (ref 5–15)
BUN: 9 mg/dL (ref 6–20)
CO2: 22 mmol/L (ref 22–32)
Calcium: 8.8 mg/dL — ABNORMAL LOW (ref 8.9–10.3)
Chloride: 106 mmol/L (ref 101–111)
Creatinine, Ser: 0.83 mg/dL (ref 0.61–1.24)
GFR calc Af Amer: >60 mL/min (ref >60)
GLUCOSE: 87 mg/dL (ref 65–99)
POTASSIUM: 3.7 mmol/L (ref 3.5–5.1)
Sodium: 138 mmol/L (ref 135–145)

## 2017-08-06 LAB — CBC
HEMATOCRIT: 42.2 % (ref 39.0–52.0)
Hemoglobin: 14.2 g/dL (ref 13.0–17.0)
MCH: 31.7 pg (ref 26.0–34.0)
MCHC: 33.6 g/dL (ref 30.0–36.0)
MCV: 94.2 fL (ref 78.0–100.0)
PLATELETS: 219 10*3/uL (ref 150–400)
RBC: 4.48 MIL/uL (ref 4.22–5.81)
RDW: 13.3 % (ref 11.5–15.5)
WBC: 4.9 10*3/uL (ref 4.0–10.5)

## 2017-08-06 LAB — PROTIME-INR
INR: 1.06
Prothrombin Time: 13.7 seconds (ref 11.4–15.2)

## 2017-08-06 SURGERY — LEFT HEART CATH AND CORONARY ANGIOGRAPHY
Anesthesia: LOCAL

## 2017-08-06 MED ORDER — VERAPAMIL HCL 2.5 MG/ML IV SOLN
INTRAVENOUS | Status: AC
  Filled 2017-08-06: qty 2

## 2017-08-06 MED ORDER — VERAPAMIL HCL 2.5 MG/ML IV SOLN
INTRAVENOUS | Status: DC | PRN
  Administered 2017-08-06: 10 mL via INTRA_ARTERIAL

## 2017-08-06 MED ORDER — SODIUM CHLORIDE 0.9% FLUSH: 3.0000 mL | INTRAVENOUS | Status: DC | PRN

## 2017-08-06 MED ORDER — FENTANYL CITRATE (PF) 100 MCG/2ML IJ SOLN
INTRAMUSCULAR | Status: AC
  Filled 2017-08-06: qty 2

## 2017-08-06 MED ORDER — IOPAMIDOL (ISOVUE-370) INJECTION 76%
INTRAVENOUS | Status: DC | PRN
  Administered 2017-08-06: 50 mL via INTRA_ARTERIAL

## 2017-08-06 MED ORDER — LIDOCAINE HCL (PF) 1 % IJ SOLN
INTRAMUSCULAR | Status: AC
  Filled 2017-08-06: qty 30

## 2017-08-06 MED ORDER — FENTANYL CITRATE (PF) 100 MCG/2ML IJ SOLN
INTRAMUSCULAR | Status: DC | PRN
  Administered 2017-08-06: 50 ug via INTRAVENOUS

## 2017-08-06 MED ORDER — LIDOCAINE HCL (PF) 1 % IJ SOLN
INTRAMUSCULAR | Status: DC | PRN
  Administered 2017-08-06: 2 mL

## 2017-08-06 MED ORDER — SODIUM CHLORIDE 0.9 % IV SOLN
INTRAVENOUS | Status: AC
  Administered 2017-08-06: 11:00:00 via INTRAVENOUS

## 2017-08-06 MED ORDER — HEPARIN (PORCINE) IN NACL 2-0.9 UNIT/ML-% IJ SOLN
INTRAMUSCULAR | Status: AC
  Filled 2017-08-06: qty 500

## 2017-08-06 MED ORDER — AMLODIPINE BESYLATE 5 MG PO TABS: 5.0000 mg | ORAL_TABLET | Freq: Every day | ORAL | 0 refills | Status: AC

## 2017-08-06 MED ORDER — ASPIRIN 81 MG PO TBEC: 81.0000 mg | DELAYED_RELEASE_TABLET | Freq: Every day | ORAL | 0 refills | Status: AC

## 2017-08-06 MED ORDER — ATORVASTATIN CALCIUM 20 MG PO TABS: 20.0000 mg | ORAL_TABLET | Freq: Every day | ORAL | 0 refills | Status: AC

## 2017-08-06 MED ORDER — SODIUM CHLORIDE 0.9% FLUSH: 3.0000 mL | Freq: Two times a day (BID) | INTRAVENOUS | Status: DC

## 2017-08-06 MED ORDER — HEPARIN SODIUM (PORCINE) 1000 UNIT/ML IJ SOLN
INTRAMUSCULAR | Status: AC
  Filled 2017-08-06: qty 1

## 2017-08-06 MED ORDER — IOPAMIDOL (ISOVUE-370) INJECTION 76%
INTRAVENOUS | Status: AC
  Filled 2017-08-06: qty 100

## 2017-08-06 MED ORDER — SODIUM CHLORIDE 0.9 % IV SOLN
INTRAVENOUS | Status: AC | PRN
  Administered 2017-08-06: 71 mL/h via INTRAVENOUS

## 2017-08-06 MED ORDER — MIDAZOLAM HCL 2 MG/2ML IJ SOLN
INTRAMUSCULAR | Status: DC | PRN
  Administered 2017-08-06: 2 mg via INTRAVENOUS

## 2017-08-06 MED ORDER — SODIUM CHLORIDE 0.9 % IV SOLN: 250.0000 mL | INTRAVENOUS | Status: DC | PRN

## 2017-08-06 MED ORDER — METOPROLOL TARTRATE 25 MG PO TABS: 25.0000 mg | ORAL_TABLET | Freq: Two times a day (BID) | ORAL | 0 refills | Status: AC

## 2017-08-06 MED ORDER — MIDAZOLAM HCL 2 MG/2ML IJ SOLN
INTRAMUSCULAR | Status: AC
  Filled 2017-08-06: qty 2

## 2017-08-06 MED ORDER — ONDANSETRON HCL 4 MG/2ML IJ SOLN: 4.0000 mg | Freq: Four times a day (QID) | INTRAMUSCULAR | Status: DC | PRN

## 2017-08-06 MED ORDER — HEPARIN (PORCINE) IN NACL 100-0.45 UNIT/ML-% IJ SOLN: 1250.0000 [IU]/h | INTRAMUSCULAR | Status: DC

## 2017-08-06 MED ORDER — HEPARIN SODIUM (PORCINE) 1000 UNIT/ML IJ SOLN
INTRAMUSCULAR | Status: DC | PRN
  Administered 2017-08-06: 3500 [IU] via INTRAVENOUS

## 2017-08-06 MED ORDER — ACETAMINOPHEN 325 MG PO TABS: 650.0000 mg | ORAL_TABLET | ORAL | Status: DC | PRN

## 2017-08-06 MED ORDER — HEPARIN (PORCINE) IN NACL 2-0.9 UNIT/ML-% IJ SOLN
INTRAMUSCULAR | Status: AC | PRN
  Administered 2017-08-06: 1000 mL

## 2017-08-06 SURGICAL SUPPLY — 13 items
CATH INFINITI 5 FR JL3.5 (CATHETERS) ×2 IMPLANT
CATH INFINITI JR4 5F (CATHETERS) ×2 IMPLANT
COVER PRB 48X5XTLSCP FOLD TPE (BAG) ×1 IMPLANT
COVER PROBE 5X48 (BAG) ×1
DEVICE RAD COMP TR BAND LRG (VASCULAR PRODUCTS) ×2 IMPLANT
GLIDESHEATH SLEND SS 6F .021 (SHEATH) ×2 IMPLANT
GUIDEWIRE INQWIRE 1.5J.035X260 (WIRE) ×1 IMPLANT
INQWIRE 1.5J .035X260CM (WIRE) ×2
KIT HEART LEFT (KITS) ×2 IMPLANT
NEEDLE PERC ENTRY 21G 2.5CM (NEEDLE) ×2 IMPLANT
PACK CARDIAC CATHETERIZATION (CUSTOM PROCEDURE TRAY) ×2 IMPLANT
TRANSDUCER W/STOPCOCK (MISCELLANEOUS) ×2 IMPLANT
TUBING CIL FLEX 10 FLL-RA (TUBING) ×2 IMPLANT

## 2017-08-06 NOTE — Interval H&P Note (Signed)
Cath Lab Visit (complete for each Cath Lab visit)  Clinical Evaluation Leading to the Procedure:   ACS: Yes.    Non-ACS:    Anginal Classification: CCS IV  Anti-ischemic medical therapy: Minimal Therapy (1 class of medications)  Non-Invasive Test Results: intermediate, echo showed RWMA  Prior CABG: No previous CABG      History and Physical Interval Note:  08/06/2017 8:58 AM  Brian George  has presented today for surgery, with the diagnosis of NSTEMI  The various methods of treatment have been discussed with the patient and family. After consideration of risks, benefits and other options for treatment, the patient has consented to  Procedure(s): LEFT HEART CATH AND CORONARY ANGIOGRAPHY (N/A) as a surgical intervention .  The patient's history has been reviewed, patient examined, no change in status, stable for surgery.  I have reviewed the patient's chart and labs.  Questions were answered to the patient's satisfaction.     Lance MussJayadeep Varanasi

## 2017-08-06 NOTE — Progress Notes (Signed)
Progress Note  Patient Name: Brian George Date of Encounter: 08/06/2017  Primary Cardiologist: Chilton Siiffany , MD   Subjective   Feels well. No chest pain. No further arrhythmia. Cardiac cath with minimal atherosclerotic plaque. EF has returned to normal and there are no regional abnormalities on LV angio.  Inpatient Medications    Scheduled Meds: . amLODipine  5 mg Oral Daily  . aspirin EC  81 mg Oral Daily  . atorvastatin  20 mg Oral q1800  . metoprolol tartrate  25 mg Oral BID  . nitroGLYCERIN  0.5 inch Topical Q8H  . pantoprazole (PROTONIX) IV  40 mg Intravenous Q12H  . sodium chloride flush  3 mL Intravenous Q12H   Continuous Infusions: . sodium chloride    . sodium chloride    . heparin     PRN Meds: sodium chloride, acetaminophen, hydrALAZINE, ondansetron (ZOFRAN) IV, ondansetron (ZOFRAN) IV, sodium chloride flush   Vital Signs    Vitals:   08/06/17 0917 08/06/17 0922 08/06/17 0927 08/06/17 0931  BP: (!) 154/111 (!) 139/95 (!) 140/103   Pulse: 69 63 (!) 58 (!) 0  Resp: (!) 7 17 (!) 27 (!) 33  Temp:      TempSrc:      SpO2: 100% 99% 100% (!) 0%  Weight:      Height:        Intake/Output Summary (Last 24 hours) at 08/06/2017 1058 Last data filed at 08/06/2017 0618 Gross per 24 hour  Intake 700.85 ml  Output 2252 ml  Net -1551.15 ml   Filed Weights   08/03/17 1359 08/04/17 0121 08/06/17 0501  Weight: 141 lb (64 kg) 159 lb 8 oz (72.3 kg) 157 lb 8 oz (71.4 kg)    Telemetry    NSR - Personally Reviewed  ECG    NSR, LAFB. No preexcitation is seen. - Personally Reviewed  The ECG from 08/03/2017 does NOT show atrial fibrillation (as reported on the ECG) or atrial flutter (as reported in the note). It shows NORMAL SINUS RHYTHM with tremor artifact (due to Parkinson's)  None of the ECGs or rhythm strips in the medical record show anything other than NSR (with gradually improving ST-T changes consistent with recovery from extreme tachycardia). The  rhythm strips from EMS show SVT, mechanism unclear, but probably not flutter.  Physical Exam  Looks very comfortable GEN: No acute distress.   Neck: No JVD Cardiac: RRR, no murmurs, rubs, or gallops.  Respiratory: Clear to auscultation bilaterally. GI: Soft, nontender, non-distended  MS: No edema; No deformity. No problems at right radial cath access site Neuro:  Nonfocal  Psych: Normal affect   Labs    Chemistry Recent Labs  Lab 08/03/17 1440 08/04/17 0311 08/05/17 0254 08/06/17 0427  NA 139 136 138 138  K 3.6 3.6 3.8 3.7  CL 109 107 108 106  CO2 15* 21* 20* 22  GLUCOSE 75 75 79 87  BUN 13 11 11 9   CREATININE 0.95 0.92 0.93 0.83  CALCIUM 8.7* 8.7* 8.4* 8.8*  PROT 7.1 7.0 5.9*  --   ALBUMIN 3.8 3.6 3.1*  --   AST 79* 48* 29  --   ALT 93* 77* 50  --   ALKPHOS 51 47 40  --   BILITOT 1.0 1.7* 0.8  --   GFRNONAA >60 >60 >60 >60  GFRAA >60 >60 >60 >60  ANIONGAP 15 8 10 10      Hematology Recent Labs  Lab 08/04/17 0311 08/05/17 0254 08/06/17 0427  WBC  7.0 5.6 4.9  RBC 4.80 4.17* 4.48  HGB 15.2 13.8 14.2  HCT 45.3 39.4 42.2  MCV 94.4 94.5 94.2  MCH 31.7 33.1 31.7  MCHC 33.6 35.0 33.6  RDW 13.6 13.5 13.3  PLT 241 271 219    Cardiac Enzymes Recent Labs  Lab 08/03/17 1733 08/04/17 0311 08/04/17 0919  TROPONINI 2.08* 1.96* 1.04*    Recent Labs  Lab 08/03/17 1403  TROPIPOC 0.05     BNPNo results for input(s): BNP, PROBNP in the last 168 hours.   DDimer No results for input(s): DDIMER in the last 168 hours.   Radiology    No results found.  Cardiac Studies   Cath 08/06/2017  LEFT HEART CATH AND CORONARY ANGIOGRAPHY  Conclusion     Mid LAD lesion is 10% stenosed.  The left ventricular systolic function is normal.  LV end diastolic pressure is normal.  The left ventricular ejection fraction is 50-55% by visual estimate.  There is no aortic valve stenosis.   Nonobstructive CAD.  Continue medical therapy for atrial flutter.     Echo 08/05/2017  - Left ventricle: The cavity size was mildly dilated. There was   mild concentric hypertrophy. Systolic function was mildly   reduced. The estimated ejection fraction was in the range of 45%   to 50%. There is hypokinesis of the inferolateral and inferior   myocardium. - Aortic valve: Trileaflet; mildly thickened, mildly calcified   leaflets. - Aorta: Aortic root dimension: 39 mm (ED). Ascending aortic   diameter: 38 mm (S). - Aortic root: The aortic root was mildly dilated. - Ascending aorta: The ascending aorta was mildly dilated. - Mitral valve: There was trivial regurgitation. - Atrial septum: There was increased thickness of the septum,   consistent with lipomatous hypertrophy.   Patient Profile     62 y.o. male with symptomatic extreme tachycardia with supraventricular mechanism and HTN, but without significant structural heart disease.  Assessment & Plan    Current dose of beta blocker may be maximum tolerated (resting HR in high 50s), but would prefer to see if it works for symptom control before proceeding to more aggressive treatment with true antiarrhythmics or referral for EPS and ablation.  Will arrange Cardiology follow up. Should be ready for DC after bedrest period post cath.  For questions or updates, please contact [facility] Please consult [email or web address] for contact info under Cardiology/STEMI.      Signed, Thurmon Fair, MD  08/06/2017, 10:58 AM

## 2017-08-06 NOTE — Discharge Summary (Signed)
Physician Discharge Summary  Brian George ZOX:[id number] DOB: May 28, 1956 DOA: 08/03/2017  PCP: Patient, No Pcp Per  Admit date: 08/03/2017 Discharge date: 08/06/2017  Time spent:  45 minutes  Recommendations for Outpatient Follow-up:  Patient will be discharged to home.  Patient will need to follow up with primary care provider within one week of discharge.  Follow up with cardiology-they will contact you with an appointment. Follow up with gastroenterology. Patient should continue medications as prescribed.  Patient should follow a heart healthy diet.   Discharge Diagnoses:  Tachycardia with supraventricular mechanism Chest discomfort with elevated troponin Bright rectal blood per rectum Essential hypertension Abnormal LFTs Nausea and vomiting Alcohol/tobacco abuse Diarrhea  Discharge Condition: Stable  Diet recommendation: Heart healthy  Filed Weights   08/03/17 1359 08/04/17 0121 08/06/17 0501  Weight: 64 kg (141 lb) 72.3 kg (159 lb 8 oz) 71.4 kg (157 lb 8 oz)    History of present illness:  on 08/03/2017 by Dr. Pearson Grippe George a61 y.o.male,w c/o dull throbbing in the chest (mid) started about 11am. After finishing drinking a beer. Pt denies fever, chills, cough, sob, heartburn, abd pain. While he was in the EMS truck , given a couple of injections, and during the ride had slight n/v, (no blood), diarrhea (loose stool) with some blood in there.   Hospital Course:  Tachycardia with supraventricular mechanism -Thought to be Atrial flutter with RVR, however -Patient appears to be in sinus rhythm at this time -Cardiology consulted appreciated -Echocardiogram EF 45-50%. Hypokinesis of inferolateral and inferior myocardium  -Left heart cath and coronary angiography: Mild LAD lesion is 10% stenosed.  LV systolic function normal.  LV end-diastolic pressure is normal.  EF 50-55%.  No aortic valve stenosis.  Nonobstructive CAD. -TSH and free T4 within normal  limits -was on heparin, however, discussed with Dr. Royann Shivers- did not recommend anticoagulation, does not believe this is AFlutter. If it was CHADSVASC 1.  Chest discomfort with elevated troponin -Suspect secondary to atrial flutter -No longer having chest discomfort -Echocardiogram as above -Troponin trending downward, peaked at 2.08  Bright rectal blood per rectum -Occurred once patient was in the emergency department -Hemoglobin appears to be stable, currently 14.2 -Gastroenterology consulted and appreicated as patient will need to be on anticoagulation given atrial flutter -patient does admit to taking ibuprofen occasionally  Essential hypertension -Continue metoprolol, amlodipine  Abnormal LFTs -Hepatitis panel pending  -resolved -Right upper quadrant ultrasound: Small non-shadowing focus in the gallbladder could reflect tumefactive sludge or non-shadowing stone. Negative for acute cholecystitis or biliary dilatation. Increased hepatic echogenicity suggesting steatosis or hepatocellular disease -patient drinks a 6-pack nightly  Nausea and vomiting -Resolved, continue anti-emetics and continue to monitor  Alcohol/tobacco abuse -Discussed cessation  Diarrhea -No longer occurring -C diff and GI pathogen panel negative  Consultants Cardiology Gastroenterology  Procedures  Echocardiogram Left heart cath  Discharge Exam: Vitals:   08/06/17 0927 08/06/17 0931  BP: (!) 140/103   Pulse: (!) 58 (!) 0  Resp: (!) 27 (!) 33  Temp:    SpO2: 100% (!) 0%     General: Well developed, well nourished, NAD, appears stated age  HEENT: NCAT, mucous membranes moist.  Cardiovascular: S1 S2 auscultated, bradycardic, no murmur  Respiratory: Clear to auscultation bilaterally with equal chest rise  Abdomen: Soft, nontender, nondistended, + bowel sounds  Extremities: warm dry without cyanosis clubbing or edema  Neuro: AAOx3, nonfocal  Psych: Appropriate mood and  affect  Discharge Instructions Discharge Instructions    Discharge instructions  Complete by:  As directed    Patient will be discharged to home.  Patient will need to follow up with primary care provider within one week of discharge.  Follow up with cardiology-they will contact you with an appointment. Follow up with gastroenterology. Patient should continue medications as prescribed.  Patient should follow a heart healthy diet.     Allergies as of 08/06/2017   Not on File     Medication List    TAKE these medications   amLODipine 5 MG tablet Commonly known as:  NORVASC Take 1 tablet (5 mg total) by mouth daily. Start taking on:  08/07/2017   aspirin 81 MG EC tablet Take 1 tablet (81 mg total) by mouth daily. Start taking on:  08/07/2017   atorvastatin 20 MG tablet Commonly known as:  LIPITOR Take 1 tablet (20 mg total) by mouth daily at 6 PM.   metoprolol tartrate 25 MG tablet Commonly known as:  LOPRESSOR Take 1 tablet (25 mg total) by mouth 2 (two) times daily.      Not on File Follow-up Information    Chilton Si, MD. Schedule an appointment as soon as possible for a visit in 1 week(s).   Specialty:  Cardiology Why:  Hospital follow up Contact information: [address] [place] [address] [phone]        Charna Elizabeth, MD. Schedule an appointment as soon as possible for a visit in 2 week(s).   Specialty:  Gastroenterology Why:  Hospital follow up Contact information: [address] [place] [address] [phone]            The results of significant diagnostics from this hospitalization (including imaging, microbiology, ancillary and laboratory) are listed below for reference.    Significant Diagnostic Studies: Dg Chest Port 1 View  Result Date: 08/03/2017 CLINICAL DATA:  Weakness. EXAM: PORTABLE CHEST 1 VIEW COMPARISON:  None. FINDINGS: Cardiomegaly identified. A 1 cm nodular opacity overlying the mid  left lung is noted. There is no evidence of focal airspace disease, pulmonary edema, pleural effusion, or pneumothorax. No acute bony abnormalities are identified. IMPRESSION: 1. 1 cm nodular opacity overlying the mid left lung which may represent nipple. Recommend repeat chest radiograph with nipple markers when able. 2. Cardiomegaly without evidence of acute cardiopulmonary disease. Electronically Signed   By: Harmon Pier M.D.   On: 08/03/2017 14:04   US Abdomen Limited Ruq  Result Date: 08/04/2017 CLINICAL DATA:  Abnormal LFT EXAM: ULTRASOUND ABDOMEN LIMITED RIGHT UPPER QUADRANT COMPARISON:  None. FINDINGS: Gallbladder: Small nonshadowing focus in the gallbladder measuring 3.6 mm. Normal wall thickness. Negative sonographic Murphy Common bile duct: Diameter: 2.5 mm Liver: Increased hepatic echogenicity. No focal hepatic abnormality. Portal vein is patent on color Doppler imaging with normal direction of blood flow towards the liver. IMPRESSION: 1. Small nonshadowing focus in the gallbladder could reflect tumefactive sludge or nonshadowing stone. Negative for acute cholecystitis or biliary dilatation 2. Increased hepatic echogenicity suggesting steatosis or hepatocellular disease Electronically Signed   By: Jasmine Pang M.D.   On: 08/04/2017 03:18    Microbiology: Recent Results (from the past 240 hour(s))  Gastrointestinal Panel by PCR , Stool     Status: None   Collection Time: 08/05/17  9:20 AM  Result Value Ref Range Status   Campylobacter species NOT DETECTED NOT DETECTED Final   Plesimonas shigelloides NOT DETECTED NOT DETECTED Final   Salmonella species NOT DETECTED NOT DETECTED Final   Yersinia enterocolitica NOT DETECTED  NOT DETECTED Final   Vibrio species NOT DETECTED NOT DETECTED Final   Vibrio cholerae NOT DETECTED NOT DETECTED Final   Enteroaggregative E coli (EAEC) NOT DETECTED NOT DETECTED Final   Enteropathogenic E coli (EPEC) NOT DETECTED NOT DETECTED Final   Enterotoxigenic E  coli (ETEC) NOT DETECTED NOT DETECTED Final   Shiga like toxin producing E coli (STEC) NOT DETECTED NOT DETECTED Final   Shigella/Enteroinvasive E coli (EIEC) NOT DETECTED NOT DETECTED Final   Cryptosporidium NOT DETECTED NOT DETECTED Final   Cyclospora cayetanensis NOT DETECTED NOT DETECTED Final   Entamoeba histolytica NOT DETECTED NOT DETECTED Final   Giardia lamblia NOT DETECTED NOT DETECTED Final   Adenovirus F40/41 NOT DETECTED NOT DETECTED Final   Astrovirus NOT DETECTED NOT DETECTED Final   Norovirus GI/GII NOT DETECTED NOT DETECTED Final   Rotavirus A NOT DETECTED NOT DETECTED Final   Sapovirus (I, II, IV, and V) NOT DETECTED NOT DETECTED Final    Comment: Performed at [facility], [address] [place], [place] [id number]  C difficile quick scan w PCR reflex     Status: None   Collection Time: 08/05/17  9:20 AM  Result Value Ref Range Status   C Diff antigen NEGATIVE NEGATIVE Final   C Diff toxin NEGATIVE NEGATIVE Final   C Diff interpretation No C. difficile detected.  Final     Labs: Basic Metabolic Panel: Recent Labs  Lab 08/03/17 1404 08/03/17 1440 08/04/17 0311 08/05/17 0254 08/06/17 0427  NA 138 139 136 138 138  K 4.0 3.6 3.6 3.8 3.7  CL 108 109 107 108 106  CO2  --  15* 21* 20* 22  GLUCOSE 98 75 75 79 87  BUN 17 13 11 11 9   CREATININE 1.10 0.95 0.92 0.93 0.83  CALCIUM  --  8.7* 8.7* 8.4* 8.8*   Liver Function Tests: Recent Labs  Lab 08/03/17 1440 08/04/17 0311 08/05/17 0254  AST 79* 48* 29  ALT 93* 77* 50  ALKPHOS 51 47 40  BILITOT 1.0 1.7* 0.8  PROT 7.1 7.0 5.9*  ALBUMIN 3.8 3.6 3.1*   No results for input(s): LIPASE, AMYLASE in the last 168 hours. No results for input(s): AMMONIA in the last 168 hours. CBC: Recent Labs  Lab 08/03/17 1404 08/03/17 1440 08/04/17 0311 08/05/17 0254 08/06/17 0427  WBC  --  4.7 7.0 5.6 4.9  NEUTROABS  --  3.2  --   --   --   HGB 16.7 15.7 15.2 13.8 14.2  HCT 49.0 47.5 45.3 39.4 42.2  MCV  --   96.5 94.4 94.5 94.2  PLT  --  145* 241 271 219   Cardiac Enzymes: Recent Labs  Lab 08/03/17 1733 08/04/17 0311 08/04/17 0919  CKTOTAL  --  229  --   CKMB  --  12.3*  --   TROPONINI 2.08* 1.96* 1.04*   BNP: BNP (last 3 results) No results for input(s): BNP in the last 8760 hours.  ProBNP (last 3 results) No results for input(s): PROBNP in the last 8760 hours.  CBG: No results for input(s): GLUCAP in the last 168 hours.     Signed:  Edsel PetrinMaryann Janice George  [facility] 08/06/2017, 12:26 PM

## 2017-08-06 NOTE — Telephone Encounter (Signed)
New message   TOC appt made on 08/20/17 at 9:00 with Theodore Demarkhonda Barrett per Lizabeth LeydenNina Hammond

## 2017-08-06 NOTE — Telephone Encounter (Signed)
**Note De-Identified Brian George Obfuscation** The pt is following up in out NL office. Will forward this message to NL triage to contact the pt.

## 2017-08-06 NOTE — Progress Notes (Signed)
[name] was discharged via wheelchair with sister.  Discharge instructions given and reviewed all medications dosages, times and side effects.  S[facility] went to [facility] to pick up new medications for patient prior to closing.  Follow up appointments reviewed and patient verbalized understanding. VSS, denies pain at discharge.

## 2017-08-06 NOTE — Progress Notes (Signed)
ANTICOAGULATION CONSULT NOTE - Follow Up Consult  Pharmacy Consult for heparin Indication: atrial fibrillation  Labs: Recent Labs    08/03/17 1440 08/03/17 1733  08/04/17 0311 08/04/17 0919  08/04/17 2153 08/05/17 0254 08/06/17 0427  HGB 15.7  --   --  15.2  --   --   --  13.8 14.2  HCT 47.5  --   --  45.3  --   --   --  39.4 42.2  PLT 145*  --   --  241  --   --   --  271 219  LABPROT  --   --   --   --   --   --   --   --  13.7  INR  --   --   --   --   --   --   --   --  1.06  HEPARINUNFRC  --   --    < > 0.10* 0.36   < > 0.36 0.55 0.81*  CREATININE 0.95  --   --  0.92  --   --   --  0.93  --   CKTOTAL  --   --   --  229  --   --   --   --   --   CKMB  --   --   --  12.3*  --   --   --   --   --   TROPONINI  --  2.08*  --  1.96* 1.04*  --   --   --   --    < > = values in this interval not displayed.    Assessment: 62yo male now above goal on heparin after two levels at goal though had been trending up; RN reports no signs of bleeding.  Goal of Therapy:  Heparin level 0.3-0.7 units/ml   Plan:  Will deccrease heparin gtt by 1-2 units/kg/hr to 1250 units/hr and check level in 6 hours.   Vernard GamblesVeronda Alonnie Bieker, PharmD, BCPS  08/06/2017,5:32 AM

## 2017-08-07 NOTE — Telephone Encounter (Signed)
There is no contact information in the patients chart

## 2017-08-08 NOTE — Telephone Encounter (Signed)
TOC - no way to reach patient as there is no contact info.

## 2017-08-16 LAB — HEPATITIS PANEL, ACUTE
HCV Ab: <0.1 s/co ratio (ref 0.0–0.9)
HEP B C IGM: NEGATIVE
Hep A IgM: NEGATIVE
Hepatitis B Surface Ag: NEGATIVE

## 2017-08-16 LAB — HIV ANTIBODY (ROUTINE TESTING W REFLEX): HIV SCREEN 4TH GENERATION: NONREACTIVE

## 2017-08-20 ENCOUNTER — Ambulatory Visit: Payer: PRIVATE HEALTH INSURANCE | Admitting: Physician Assistant

## 2017-08-20 NOTE — Progress Notes (Deleted)
Cardiology Office Note   Date:  08/20/2017   ID:  Brian George, DOB 05/20/1956, MRN [id number]  PCP:  Patient, No Pcp Per  Cardiologist: Dr. Duke Salviaandolph, in hospital 08/03/2017 Brian Demarkhonda Patrcia Schnepp, PA-C   No chief complaint on file.   History of Present Illness: Brian George is a 62 y.o. male with a history of Parkinson's disease, ETOH abuse, arrhythmia  Admitted 1/25-1/28/2019 for chest pain, nonobstructive disease at cath and normal EF, HTN, his arrhythmia was (symptomatic) extreme tachycardia (HR >200) with supraventricular mechanism    Brian George presents for ***   Past Medical History:  Diagnosis Date  . Atrial flutter with rapid ventricular response (HCC) 08/03/2017  . Chest pain with moderate risk for cardiac etiology 08/03/2017  . Parkinson's disease [facility](HCC)     Past Surgical History:  Procedure Laterality Date  . LEFT HEART CATH AND CORONARY ANGIOGRAPHY N/A 08/06/2017   Procedure: LEFT HEART CATH AND CORONARY ANGIOGRAPHY;  Surgeon: Corky CraftsVaranasi, Jayadeep S, MD;  [facility];  Service: Cardiovascular;  Laterality: N/A;    Current Outpatient Medications  Medication Sig Dispense Refill  . amLODipine (NORVASC) 5 MG tablet Take 1 tablet (5 mg total) by mouth daily. 30 tablet 0  . aspirin EC 81 MG EC tablet Take 1 tablet (81 mg total) by mouth daily. 30 tablet 0  . atorvastatin (LIPITOR) 20 MG tablet Take 1 tablet (20 mg total) by mouth daily at 6 PM. 30 tablet 0  . metoprolol tartrate (LOPRESSOR) 25 MG tablet Take 1 tablet (25 mg total) by mouth 2 (two) times daily. 60 tablet 0   No current facility-administered medications for this visit.     Allergies:   Patient has no allergy information on record.    Social History:  The patient  reports that he has been smoking cigarettes.  He has been smoking about 0.50 packs per day. he has never used smokeless tobacco. He reports that he drinks about 25.2 oz of alcohol per week. He reports that he does not use  drugs.   Family History:  The patient's family history includes Heart defect (age of onset: 4138) in his brother; Heart defect (age of onset: 5452) in his father.    ROS:  Please see the history of present illness. All other systems are reviewed and negative.    PHYSICAL EXAM: VS:  There were no vitals taken for this visit. , BMI There is no height or weight on file to calculate BMI. GEN: Well nourished, well developed, male in no acute distress  HEENT: normal for age  Neck: no JVD, no carotid bruit, no masses Cardiac: RRR; no murmur, no rubs, or gallops Respiratory:  clear to auscultation bilaterally, normal work of breathing GI: soft, nontender, nondistended, + BS MS: no deformity or atrophy; no edema; distal pulses are 2+ in all 4 extremities   Skin: warm and dry, no rash Neuro:  Strength and sensation are intact Psych: euthymic mood, full affect   EKG:  EKG {ACTION; IS/IS [id number]} ordered today. The ekg ordered today demonstrates ***  Cath 08/06/2017  LEFT HEART CATH AND CORONARY ANGIOGRAPHY  Conclusion     Mid LAD lesion is 10% stenosed.  The left ventricular systolic function is normal.  LV end diastolic pressure is normal.  The left ventricular ejection fraction is 50-55% by visual estimate.  There is no aortic valve stenosis.  Nonobstructive CAD.  Continue medical therapy for atrial flutter.    Echo 08/05/2017  - Left ventricle:  The cavity size was mildly dilated. There was mild concentric hypertrophy. Systolic function was mildly reduced. The estimated ejection fraction was in the range of 45% to 50%. There is hypokinesis of the inferolateral and inferior myocardium. - Aortic valve: Trileaflet; mildly thickened, mildly calcified leaflets. - Aorta: Aortic root dimension: 39 mm (ED). Ascending aortic diameter: 38 mm (S). - Aortic root: The aortic root was mildly dilated. - Ascending aorta: The ascending aorta was mildly  dilated. - Mitral valve: There was trivial regurgitation. - Atrial septum: There was increased thickness of the septum, consistent with lipomatous hypertrophy.      Recent Labs: 08/04/2017: TSH 0.367 08/05/2017: ALT 50 08/06/2017: BUN 9; Creatinine, Ser 0.83; Hemoglobin 14.2; Platelets 219; Potassium 3.7; Sodium 138    Lipid Panel    Component Value Date/Time   CHOL 167 08/04/2017 0311   TRIG 46 08/04/2017 0311   HDL 59 08/04/2017 0311   CHOLHDL 2.8 08/04/2017 0311   VLDL 9 08/04/2017 0311   LDLCALC 99 08/04/2017 0311     Wt Readings from Last 3 Encounters:  08/06/17 157 lb 8 oz (71.4 kg)     Other studies Reviewed: Additional studies/ records that were reviewed today include: ***.  ASSESSMENT AND PLAN:  1.  ***   Current medicines are reviewed at length with the patient today.  The patient {ACTIONS; HAS/DOES NOT HAVE:19233} concerns regarding medicines.  The following changes have been made:  {PLAN; NO CHANGE:13088:s}  Labs/ tests ordered today include: *** No orders of the defined types were placed in this encounter.    Disposition:   FU with Dr. Duke Salvia  Signed, Brian Demark, PA-C  08/20/2017 8:00 AM    [facility] Phone: [phone]; Fax: [phone]  This note was written with the assistance of speech recognition software. Please excuse any transcriptional errors.

## 2017-08-21 ENCOUNTER — Encounter: Payer: Self-pay | Admitting: *Deleted

## 2017-12-10 DIAGNOSIS — Z125 Encounter for screening for malignant neoplasm of prostate: Secondary | ICD-10-CM | POA: Diagnosis not present

## 2017-12-10 DIAGNOSIS — I4891 Unspecified atrial fibrillation: Secondary | ICD-10-CM | POA: Diagnosis not present

## 2017-12-10 DIAGNOSIS — I10 Essential (primary) hypertension: Secondary | ICD-10-CM | POA: Diagnosis not present

## 2017-12-10 DIAGNOSIS — Z5181 Encounter for therapeutic drug level monitoring: Secondary | ICD-10-CM | POA: Diagnosis not present

## 2017-12-10 DIAGNOSIS — R251 Tremor, unspecified: Secondary | ICD-10-CM | POA: Diagnosis not present

## 2017-12-10 DIAGNOSIS — E785 Hyperlipidemia, unspecified: Secondary | ICD-10-CM | POA: Diagnosis not present

## 2017-12-10 DIAGNOSIS — Z131 Encounter for screening for diabetes mellitus: Secondary | ICD-10-CM | POA: Diagnosis not present

## 2017-12-10 DIAGNOSIS — Z1389 Encounter for screening for other disorder: Secondary | ICD-10-CM | POA: Diagnosis not present

## 2018-05-17 DIAGNOSIS — I10 Essential (primary) hypertension: Secondary | ICD-10-CM | POA: Diagnosis not present

## 2018-05-17 DIAGNOSIS — I2510 Atherosclerotic heart disease of native coronary artery without angina pectoris: Secondary | ICD-10-CM | POA: Diagnosis not present

## 2018-05-17 DIAGNOSIS — R079 Chest pain, unspecified: Secondary | ICD-10-CM | POA: Diagnosis not present

## 2018-05-17 DIAGNOSIS — I4891 Unspecified atrial fibrillation: Secondary | ICD-10-CM | POA: Diagnosis not present

## 2018-05-17 DIAGNOSIS — R0602 Shortness of breath: Secondary | ICD-10-CM | POA: Diagnosis not present

## 2018-05-17 DIAGNOSIS — I160 Hypertensive urgency: Secondary | ICD-10-CM | POA: Diagnosis not present

## 2018-05-17 DIAGNOSIS — I509 Heart failure, unspecified: Secondary | ICD-10-CM | POA: Diagnosis not present

## 2018-05-17 DIAGNOSIS — R51 Headache: Secondary | ICD-10-CM | POA: Diagnosis not present

## 2018-05-18 DIAGNOSIS — I2510 Atherosclerotic heart disease of native coronary artery without angina pectoris: Secondary | ICD-10-CM | POA: Diagnosis not present

## 2018-05-18 DIAGNOSIS — I10 Essential (primary) hypertension: Secondary | ICD-10-CM | POA: Diagnosis not present

## 2018-05-18 DIAGNOSIS — I4891 Unspecified atrial fibrillation: Secondary | ICD-10-CM | POA: Diagnosis not present

## 2018-05-18 DIAGNOSIS — I160 Hypertensive urgency: Secondary | ICD-10-CM | POA: Diagnosis not present

## 2018-05-18 DIAGNOSIS — I509 Heart failure, unspecified: Secondary | ICD-10-CM | POA: Diagnosis not present

## 2018-05-19 DIAGNOSIS — I160 Hypertensive urgency: Secondary | ICD-10-CM | POA: Diagnosis not present

## 2018-05-21 DIAGNOSIS — F17210 Nicotine dependence, cigarettes, uncomplicated: Secondary | ICD-10-CM | POA: Diagnosis not present

## 2018-05-21 DIAGNOSIS — R1084 Generalized abdominal pain: Secondary | ICD-10-CM | POA: Diagnosis not present

## 2018-05-21 DIAGNOSIS — Z720 Tobacco use: Secondary | ICD-10-CM | POA: Diagnosis not present

## 2018-05-21 DIAGNOSIS — R109 Unspecified abdominal pain: Secondary | ICD-10-CM | POA: Diagnosis not present

## 2018-05-21 DIAGNOSIS — N401 Enlarged prostate with lower urinary tract symptoms: Secondary | ICD-10-CM | POA: Diagnosis not present

## 2018-05-21 DIAGNOSIS — N400 Enlarged prostate without lower urinary tract symptoms: Secondary | ICD-10-CM | POA: Diagnosis not present

## 2018-05-21 DIAGNOSIS — I10 Essential (primary) hypertension: Secondary | ICD-10-CM | POA: Diagnosis not present

## 2018-05-23 DIAGNOSIS — R109 Unspecified abdominal pain: Secondary | ICD-10-CM | POA: Diagnosis not present

## 2018-09-15 IMAGING — DX DG CHEST 1V PORT
1 series · 1 of 1 positions shown · non-contrast
Comparison: None.

CLINICAL DATA: Weakness.

EXAM:
PORTABLE CHEST 1 VIEW

[chest]
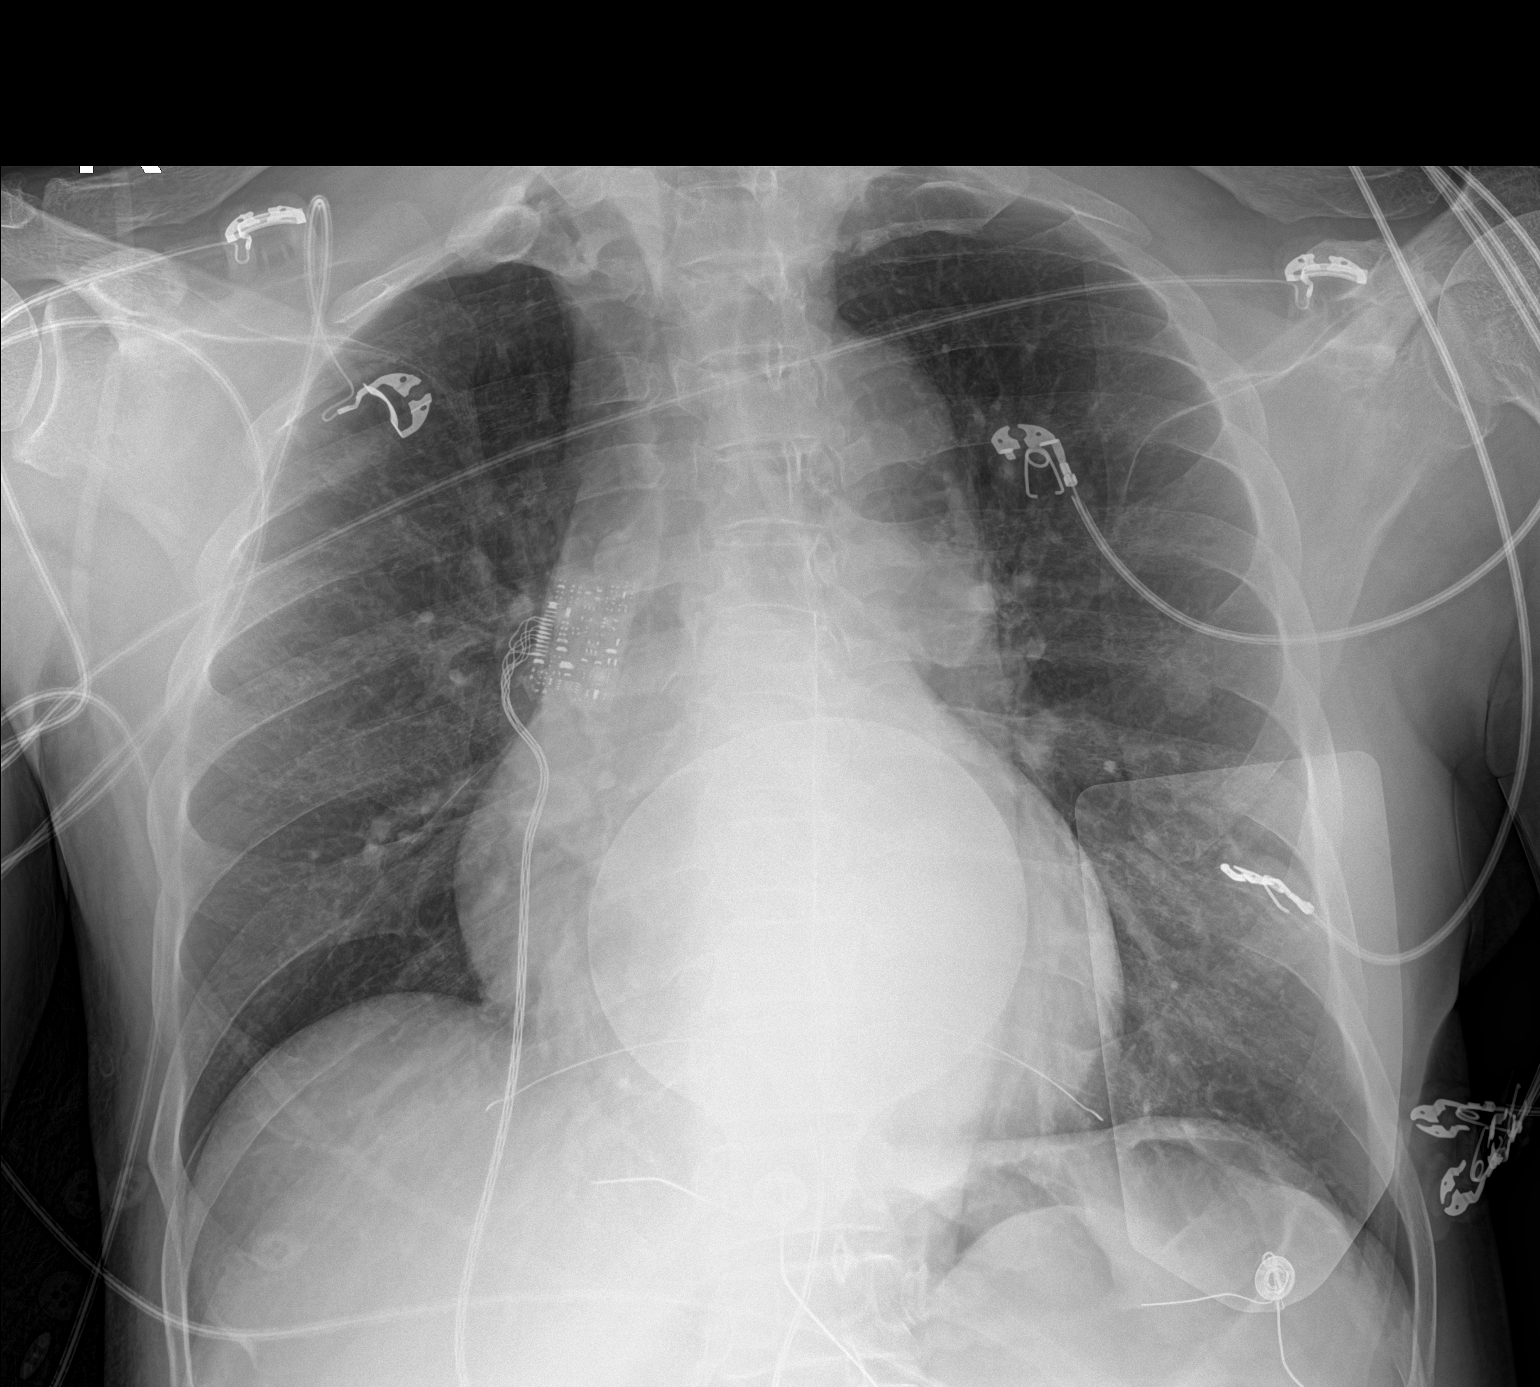

[1 of 1 positions shown; findings below may reference images not displayed]

FINDINGS: Cardiomegaly identified.

A 1 cm nodular opacity overlying the mid left lung is noted.

There is no evidence of focal airspace disease, pulmonary edema,
pleural effusion, or pneumothorax.

No acute bony abnormalities are identified.
IMPRESSION: 1. 1 cm nodular opacity overlying the mid left lung which may
represent nipple. Recommend repeat chest radiograph with nipple
markers when able.
2. Cardiomegaly without evidence of acute cardiopulmonary disease.
